# Patient Record
Sex: Female | Born: 1970 | Hispanic: No | Marital: Married | Smoking: Never smoker
Health system: Southern US, Community
[De-identification: ages and names within clinical notes are randomized; demographics above are authoritative.]

## PROBLEM LIST (undated history)

## (undated) HISTORY — PX: BREAST BIOPSY: SHX20

---

## 1997-07-18 ENCOUNTER — Encounter: Admission: RE | Admit: 1997-07-18 | Discharge: 1997-07-18 | Payer: Self-pay | Admitting: Sports Medicine

## 1997-08-15 ENCOUNTER — Encounter: Admission: RE | Admit: 1997-08-15 | Discharge: 1997-08-15 | Payer: Self-pay | Admitting: Family Medicine

## 1997-09-11 ENCOUNTER — Ambulatory Visit (HOSPITAL_COMMUNITY): Admission: RE | Admit: 1997-09-11 | Discharge: 1997-09-11 | Payer: Self-pay | Admitting: Family Medicine

## 1997-09-28 ENCOUNTER — Encounter: Admission: RE | Admit: 1997-09-28 | Discharge: 1997-09-28 | Payer: Self-pay | Admitting: Sports Medicine

## 1997-10-02 ENCOUNTER — Encounter: Admission: RE | Admit: 1997-10-02 | Discharge: 1997-10-02 | Payer: Self-pay | Admitting: Family Medicine

## 1997-10-27 ENCOUNTER — Ambulatory Visit (HOSPITAL_COMMUNITY): Admission: RE | Admit: 1997-10-27 | Discharge: 1997-10-27 | Payer: Self-pay | Admitting: *Deleted

## 1997-11-01 ENCOUNTER — Encounter: Admission: RE | Admit: 1997-11-01 | Discharge: 1997-11-01 | Payer: Self-pay | Admitting: Family Medicine

## 1997-11-02 ENCOUNTER — Encounter: Admission: RE | Admit: 1997-11-02 | Discharge: 1997-11-02 | Payer: Self-pay | Admitting: Family Medicine

## 1997-11-06 ENCOUNTER — Encounter: Admission: RE | Admit: 1997-11-06 | Discharge: 1997-11-06 | Payer: Self-pay | Admitting: Family Medicine

## 1997-11-16 ENCOUNTER — Encounter: Admission: RE | Admit: 1997-11-16 | Discharge: 1997-11-16 | Payer: Self-pay | Admitting: Family Medicine

## 1997-11-29 ENCOUNTER — Encounter: Admission: RE | Admit: 1997-11-29 | Discharge: 1997-11-29 | Payer: Self-pay | Admitting: Family Medicine

## 1997-12-06 ENCOUNTER — Encounter: Admission: RE | Admit: 1997-12-06 | Discharge: 1997-12-06 | Payer: Self-pay | Admitting: Family Medicine

## 1997-12-07 ENCOUNTER — Encounter: Admission: RE | Admit: 1997-12-07 | Discharge: 1997-12-07 | Payer: Self-pay | Admitting: Family Medicine

## 1997-12-12 ENCOUNTER — Encounter: Admission: RE | Admit: 1997-12-12 | Discharge: 1997-12-12 | Payer: Self-pay | Admitting: Family Medicine

## 1998-01-05 ENCOUNTER — Encounter: Admission: RE | Admit: 1998-01-05 | Discharge: 1998-01-05 | Payer: Self-pay | Admitting: Family Medicine

## 1998-01-06 ENCOUNTER — Inpatient Hospital Stay (HOSPITAL_COMMUNITY): Admission: AD | Admit: 1998-01-06 | Discharge: 1998-01-06 | Payer: Self-pay | Admitting: Obstetrics

## 1998-01-10 ENCOUNTER — Encounter: Admission: RE | Admit: 1998-01-10 | Discharge: 1998-01-10 | Payer: Self-pay | Admitting: Family Medicine

## 1998-01-10 ENCOUNTER — Inpatient Hospital Stay (HOSPITAL_COMMUNITY): Admission: AD | Admit: 1998-01-10 | Discharge: 1998-01-10 | Payer: Self-pay | Admitting: Obstetrics

## 1998-01-18 ENCOUNTER — Encounter: Admission: RE | Admit: 1998-01-18 | Discharge: 1998-01-18 | Payer: Self-pay | Admitting: Family Medicine

## 1998-01-19 ENCOUNTER — Inpatient Hospital Stay (HOSPITAL_COMMUNITY): Admission: AD | Admit: 1998-01-19 | Discharge: 1998-01-20 | Payer: Self-pay | Admitting: *Deleted

## 1998-03-05 ENCOUNTER — Encounter: Admission: RE | Admit: 1998-03-05 | Discharge: 1998-03-05 | Payer: Self-pay | Admitting: Family Medicine

## 1998-04-17 ENCOUNTER — Encounter: Admission: RE | Admit: 1998-04-17 | Discharge: 1998-04-17 | Payer: Self-pay | Admitting: Sports Medicine

## 1998-04-20 ENCOUNTER — Encounter: Admission: RE | Admit: 1998-04-20 | Discharge: 1998-04-20 | Payer: Self-pay | Admitting: Obstetrics & Gynecology

## 2003-07-06 ENCOUNTER — Other Ambulatory Visit: Admission: RE | Admit: 2003-07-06 | Discharge: 2003-07-06 | Payer: Self-pay | Admitting: *Deleted

## 2006-06-09 ENCOUNTER — Encounter: Admission: RE | Admit: 2006-06-09 | Discharge: 2006-06-09 | Payer: Self-pay | Admitting: *Deleted

## 2006-11-18 ENCOUNTER — Encounter: Admission: RE | Admit: 2006-11-18 | Discharge: 2006-11-18 | Payer: Self-pay | Admitting: *Deleted

## 2007-01-15 ENCOUNTER — Encounter (INDEPENDENT_AMBULATORY_CARE_PROVIDER_SITE_OTHER): Payer: Self-pay | Admitting: Diagnostic Radiology

## 2007-01-15 ENCOUNTER — Encounter: Admission: RE | Admit: 2007-01-15 | Discharge: 2007-01-15 | Payer: Self-pay | Admitting: Family Medicine

## 2007-05-11 IMAGING — US UNKNOWN US STUDY
1 series · 4 of 4 positions shown · non-contrast
Comparison: none

DG DIAGNOSTIC BILATERAL
Bilateral CC and MLO view(s) were taken.

LEFT BREAST ULTRASOUND
DIGITAL BILATERAL DIAGNOSTIC MAMMOGRAM AND LEFT BREAST ULTRASOUND:
CLINICAL DATA: Left breast lump in the upper inner quadrant.

[Series 1: unknown us study · 4 of 4 slices shown]
[im 1/4]
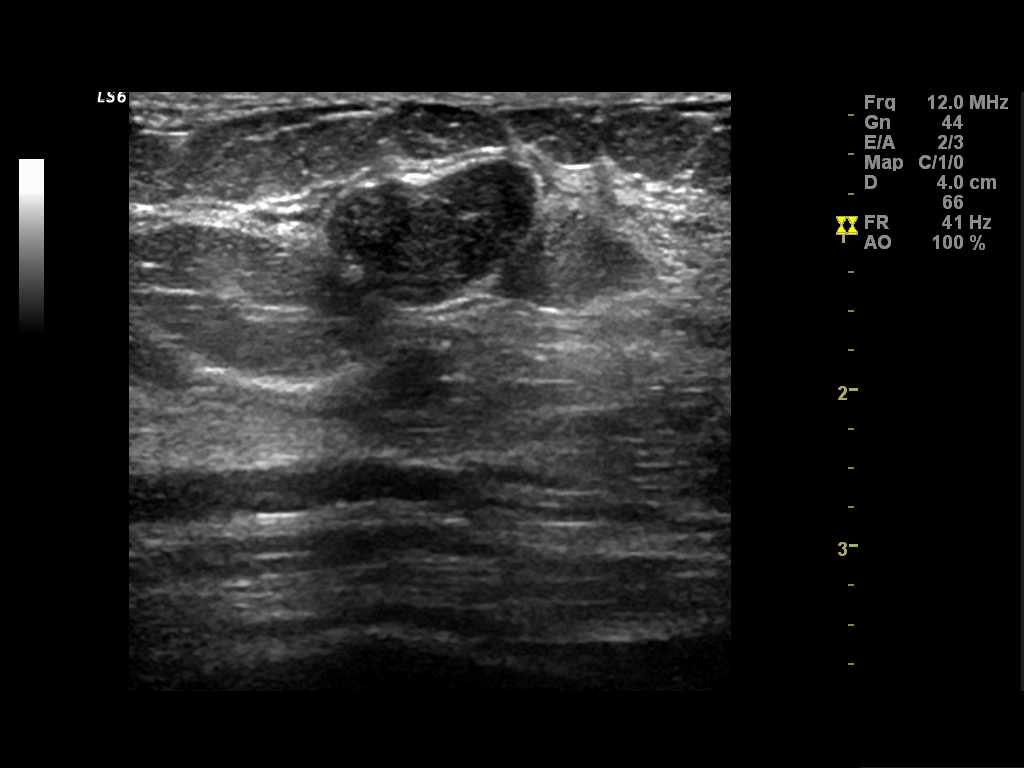
[im 2/4]
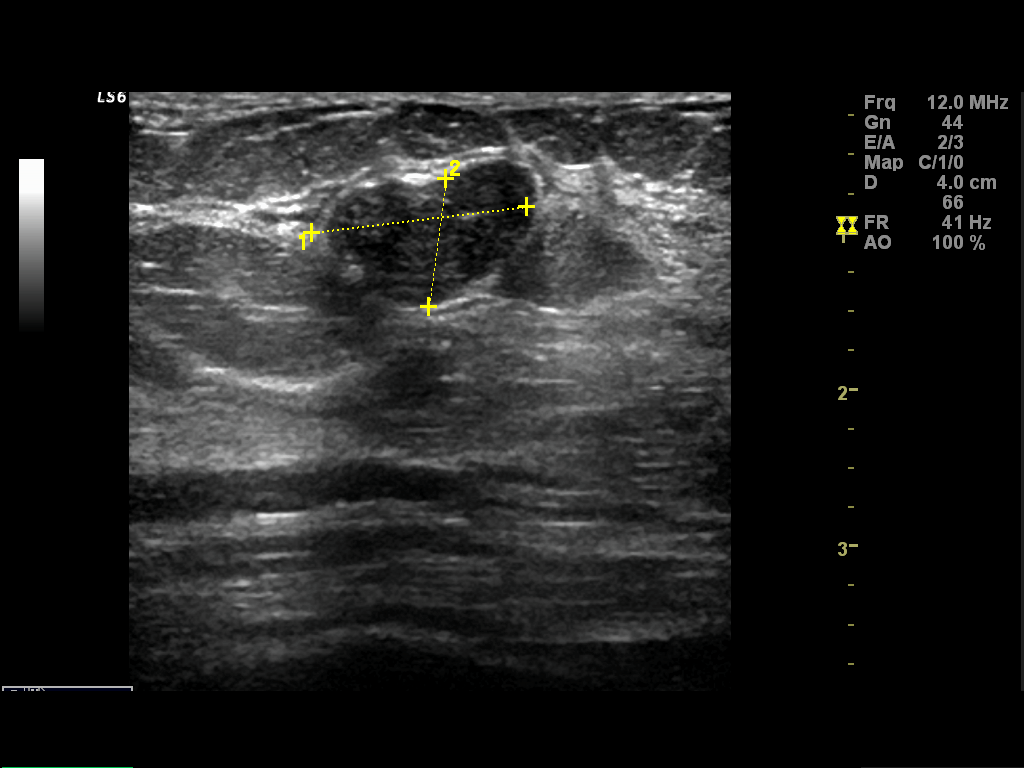
[im 3/4]
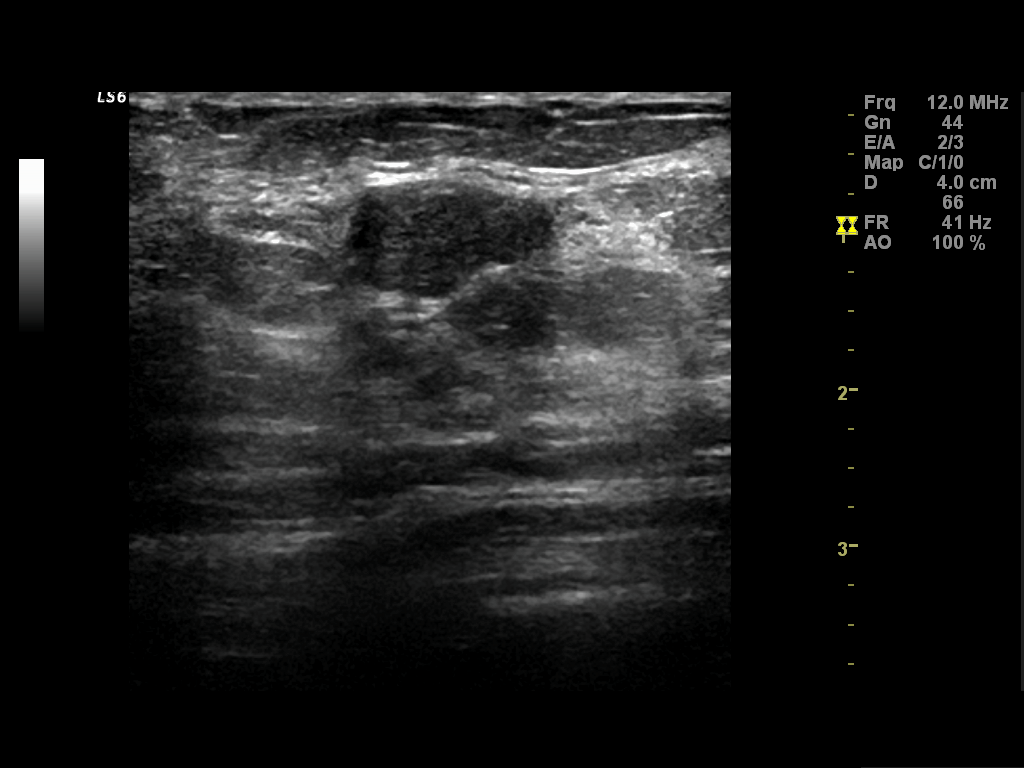
[im 4/4]
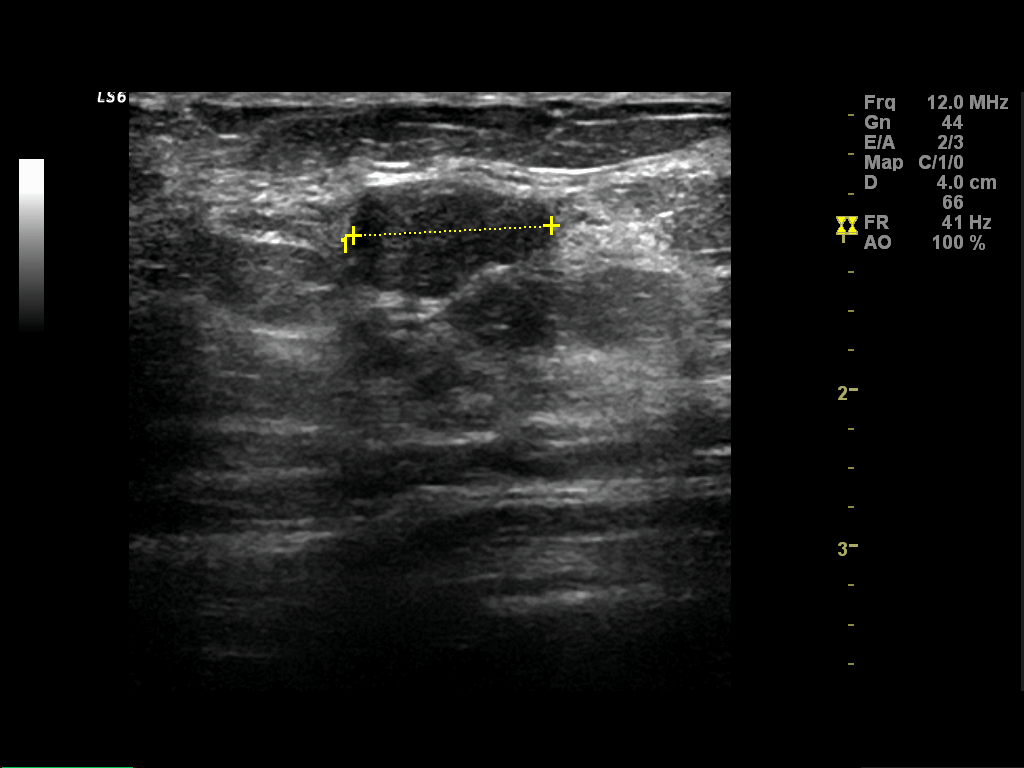

[4 of 4 positions shown; findings below may reference images not displayed]

This is the patient's baseline mammogram.

The fibroglandular parenchyma is symmetric.  There is a well-defined nodule in the upper left 
breast.  No suspicious calcifications or areas of architectural distortion are seen bilaterally.

Physical exam of the left breast reveals diffuse lumpy tissue throughout the upper inner quadrant. 
In the patient's region of palpable concern, dense fibroglandular tissue only is identified.  
Ultrasound also reveals a  well-defined solid mass with echogenic margins and increased through 
transmission at the [DATE] position, 2 cm from the nipple which correlates with the well-defined 
mass seen on the mammogram.  This separate from the patient's area of palpable concern.  The 
overall dimensions of this mass are 1.4 x 0.8 x 1.3 cm.
IMPRESSION: There is no specific radiographic evidence of malignancy bilaterally.  Findings are consistent with
a 1.3 cm fibroadenoma at the [DATE] position of the left breast, 2 cm from the nipple which is 
separate from the patient's area of palpable concern on the left.  The patient's area of palpable 
concern represents normal fibroglandular tissue only.  Six-month follow-up left breast ultrasound 
is recommended.  Options of surgical excision or biopsy were also discussed with the patient.

ASSESSMENT: Probably benign - BI-RADS 3

Ultrasound of the left breast in 6 months.
,

## 2010-05-06 ENCOUNTER — Encounter: Payer: Self-pay | Admitting: General Surgery

## 2010-07-03 ENCOUNTER — Other Ambulatory Visit: Payer: Self-pay | Admitting: Obstetrics and Gynecology

## 2012-01-07 ENCOUNTER — Other Ambulatory Visit: Payer: Self-pay | Admitting: General Practice

## 2012-01-07 DIAGNOSIS — Z1231 Encounter for screening mammogram for malignant neoplasm of breast: Secondary | ICD-10-CM

## 2012-01-28 ENCOUNTER — Other Ambulatory Visit: Payer: Self-pay | Admitting: Obstetrics and Gynecology

## 2012-01-28 ENCOUNTER — Ambulatory Visit
Admission: RE | Admit: 2012-01-28 | Discharge: 2012-01-28 | Disposition: A | Discharge status: Home / Self Care | Payer: Self-pay | Source: Ambulatory Visit | Attending: General Practice | Admitting: General Practice

## 2012-01-28 DIAGNOSIS — Z1231 Encounter for screening mammogram for malignant neoplasm of breast: Secondary | ICD-10-CM

## 2013-01-26 ENCOUNTER — Other Ambulatory Visit: Payer: Self-pay | Admitting: Obstetrics and Gynecology

## 2013-01-26 DIAGNOSIS — Z1231 Encounter for screening mammogram for malignant neoplasm of breast: Secondary | ICD-10-CM

## 2013-02-24 ENCOUNTER — Ambulatory Visit
Admission: RE | Admit: 2013-02-24 | Discharge: 2013-02-24 | Disposition: A | Discharge status: Home / Self Care | Payer: Self-pay | Source: Ambulatory Visit | Attending: Obstetrics and Gynecology | Admitting: Obstetrics and Gynecology

## 2013-02-24 DIAGNOSIS — Z1231 Encounter for screening mammogram for malignant neoplasm of breast: Secondary | ICD-10-CM

## 2014-01-26 ENCOUNTER — Other Ambulatory Visit: Payer: Self-pay

## 2014-01-26 DIAGNOSIS — Z1239 Encounter for other screening for malignant neoplasm of breast: Secondary | ICD-10-CM

## 2014-01-26 IMAGING — MG STANDARD SCREENING - COMBO
8 series · 8 of 24 positions shown · non-contrast
Comparison: Previous exam(s).

CLINICAL DATA: Screening.

EXAM:
DIGITAL SCREENING BILATERAL MAMMOGRAM WITH CAD
DIGITAL BREAST TOMOSYNTHESIS
Digital breast tomosynthesis images are acquired in two projections.
These images are reviewed in combination with the digital mammogram,
confirming the findings below.

[R MLO]
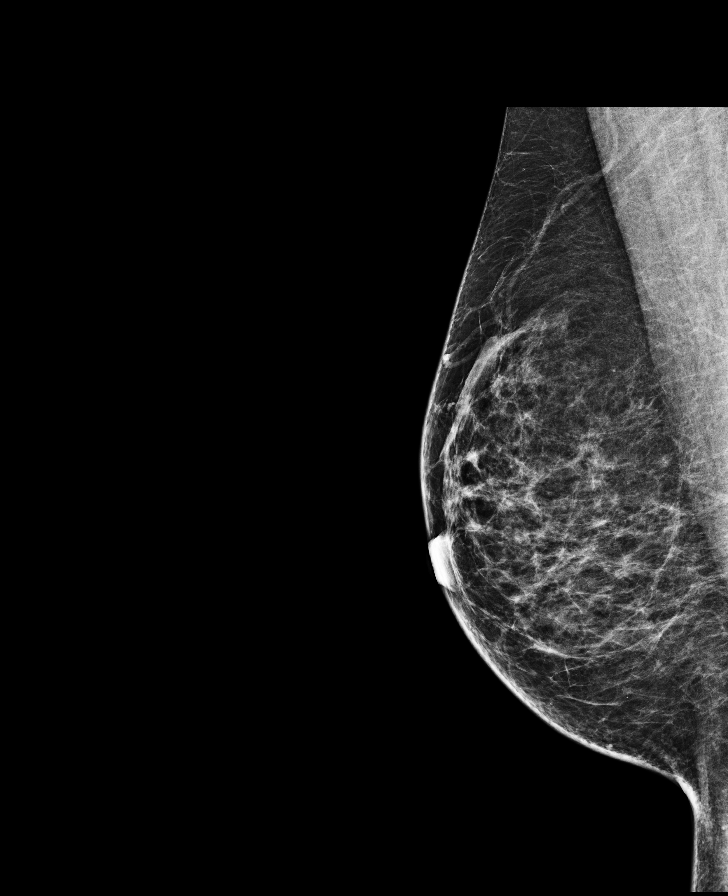

[L MLO]
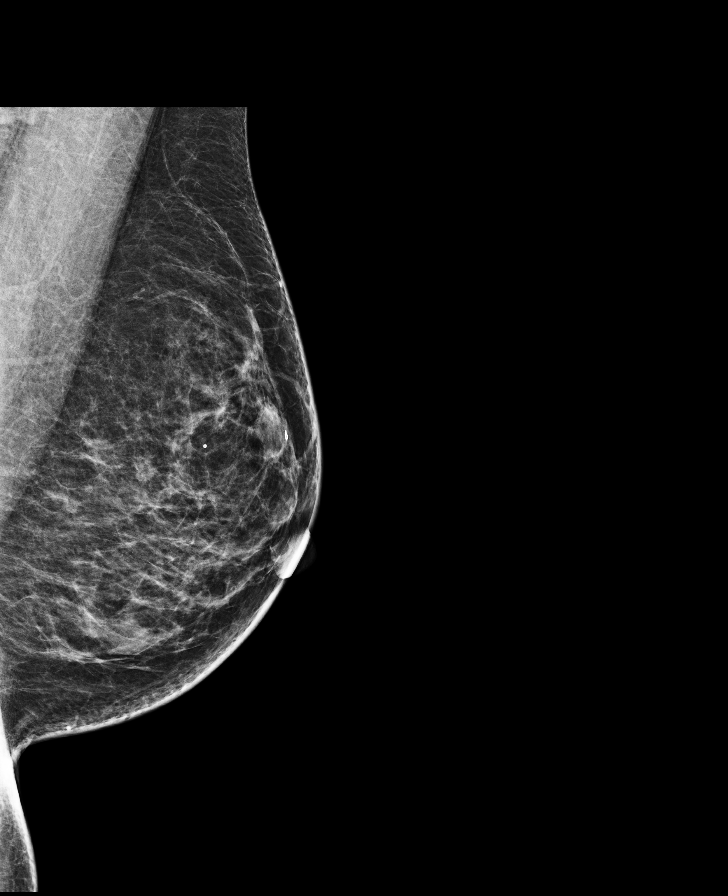

[L CC]
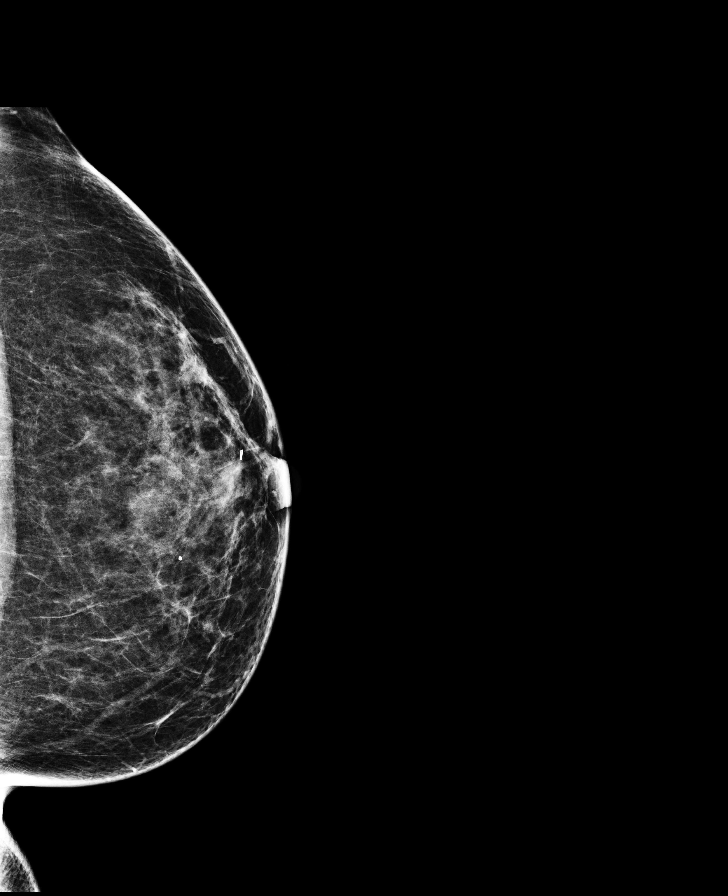

[R CC]
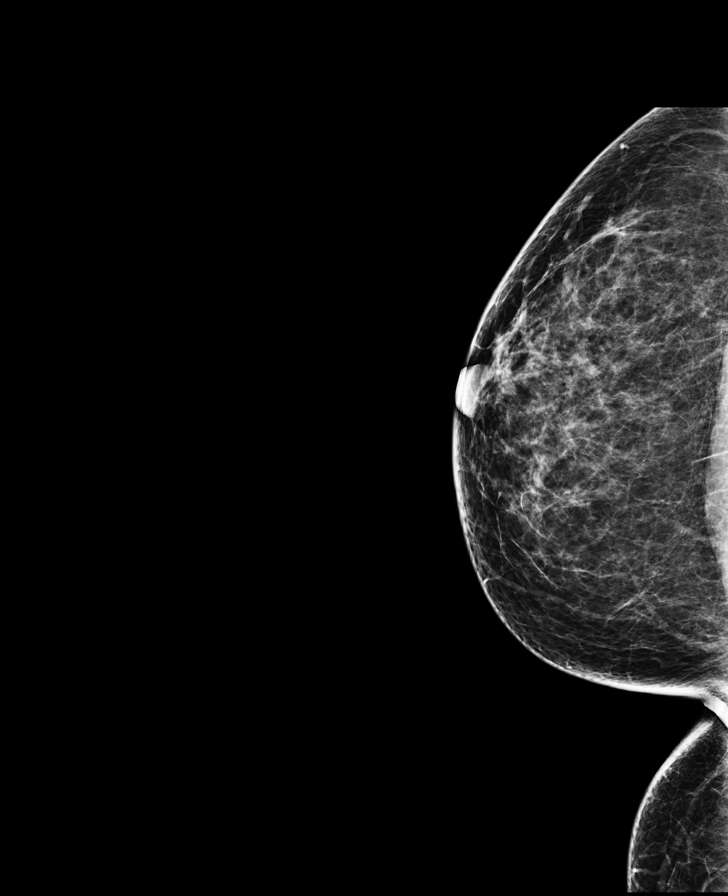

[L CC tomo · tomo slice 37/72.0]
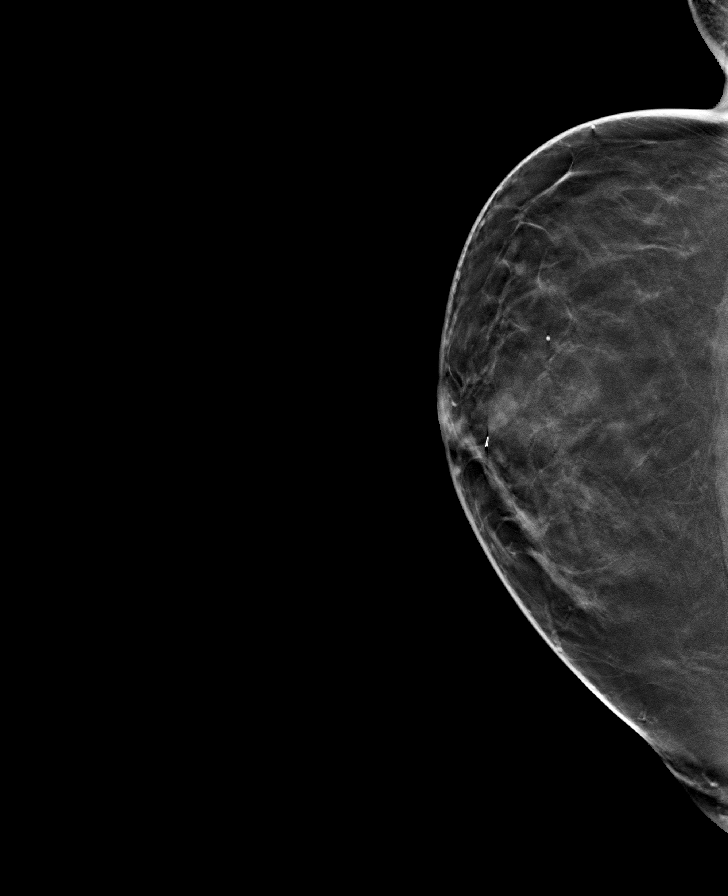

[L MLO tomo · tomo slice 35/70.0]
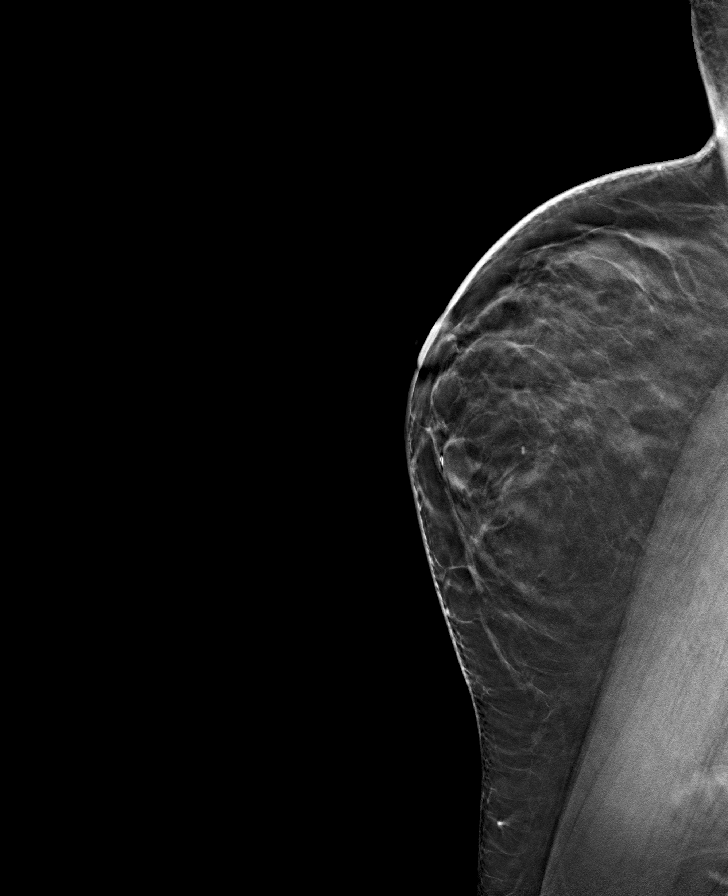

[R MLO tomo · tomo slice 34/67.0]
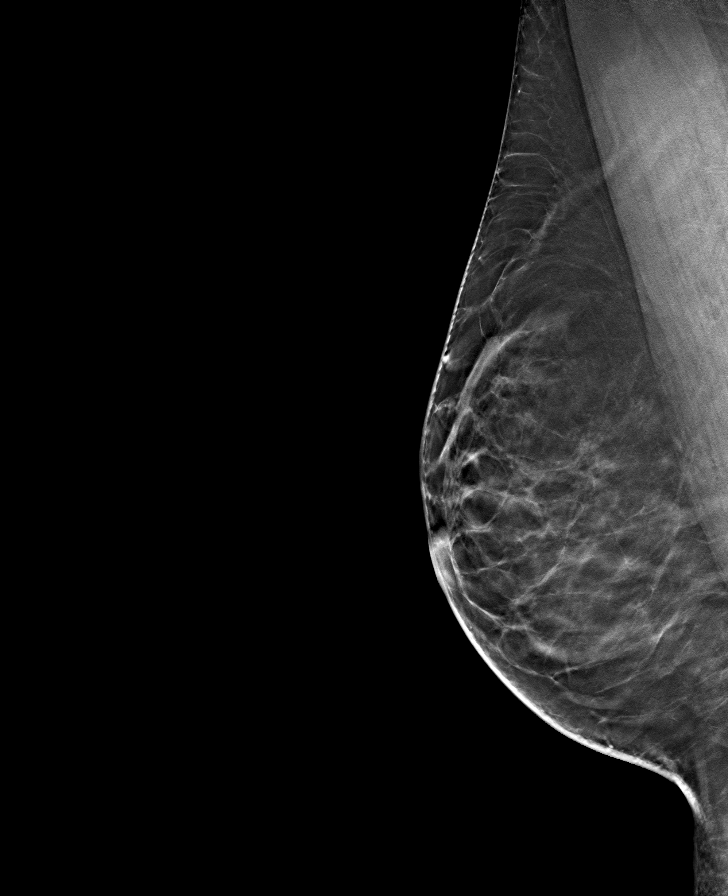

[R CC tomo · tomo slice 37/72.0]
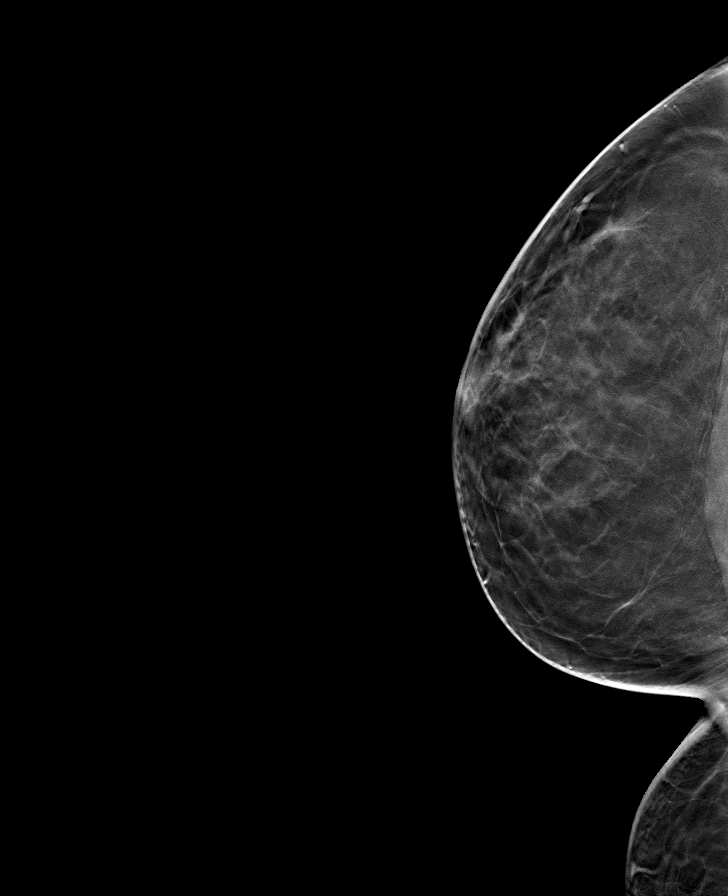

[8 of 24 positions shown; findings below may reference images not displayed]

ACR Breast Density Category c: The breasts are heterogeneously
dense, which may obscure small masses.
FINDINGS: There are no findings suspicious for malignancy. Images were
processed with CAD.
IMPRESSION: No mammographic evidence of malignancy. A result letter of this
screening mammogram will be mailed directly to the patient.

RECOMMENDATION:
Screening mammogram in one year. (Code:R9-[id number])

BI-RADS CATEGORY  1: Negative

## 2014-02-24 ENCOUNTER — Other Ambulatory Visit: Payer: Self-pay

## 2014-02-24 DIAGNOSIS — Z1231 Encounter for screening mammogram for malignant neoplasm of breast: Secondary | ICD-10-CM

## 2014-02-27 ENCOUNTER — Ambulatory Visit
Admission: RE | Admit: 2014-02-27 | Discharge: 2014-02-27 | Disposition: A | Discharge status: Home / Self Care | Payer: BC Managed Care – PPO | Source: Ambulatory Visit

## 2014-02-27 DIAGNOSIS — Z1231 Encounter for screening mammogram for malignant neoplasm of breast: Secondary | ICD-10-CM

## 2015-01-29 IMAGING — MG MM SCREENING BREAST TOMO BILATERAL
8 series · 8 of 24 positions shown · non-contrast
Comparison: Previous exam(s).

CLINICAL DATA: Screening.

EXAM:
DIGITAL SCREENING BILATERAL MAMMOGRAM WITH 3D TOMO WITH CAD

[R CC]
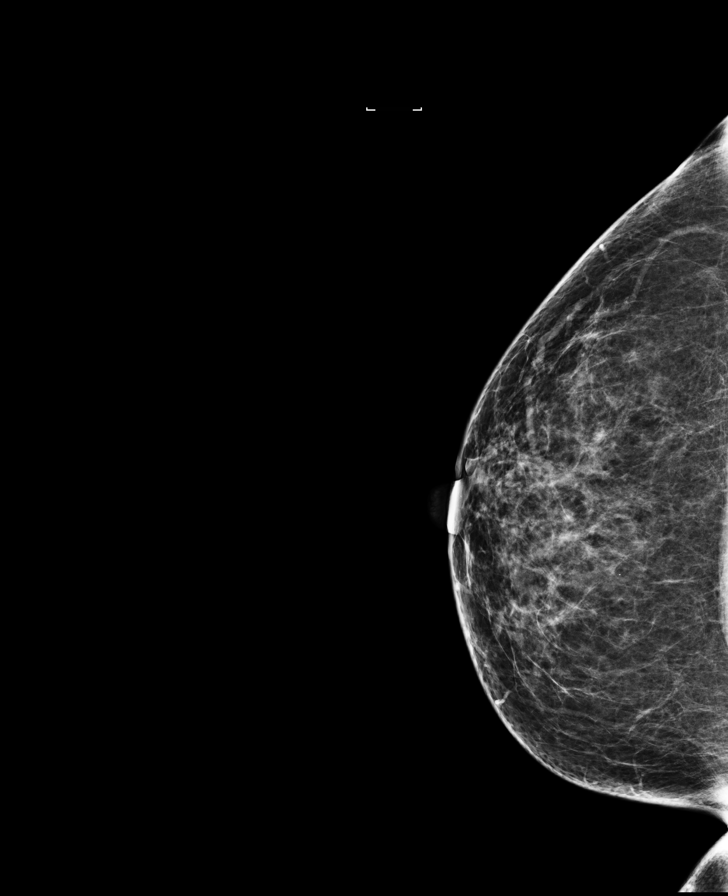

[L CC]
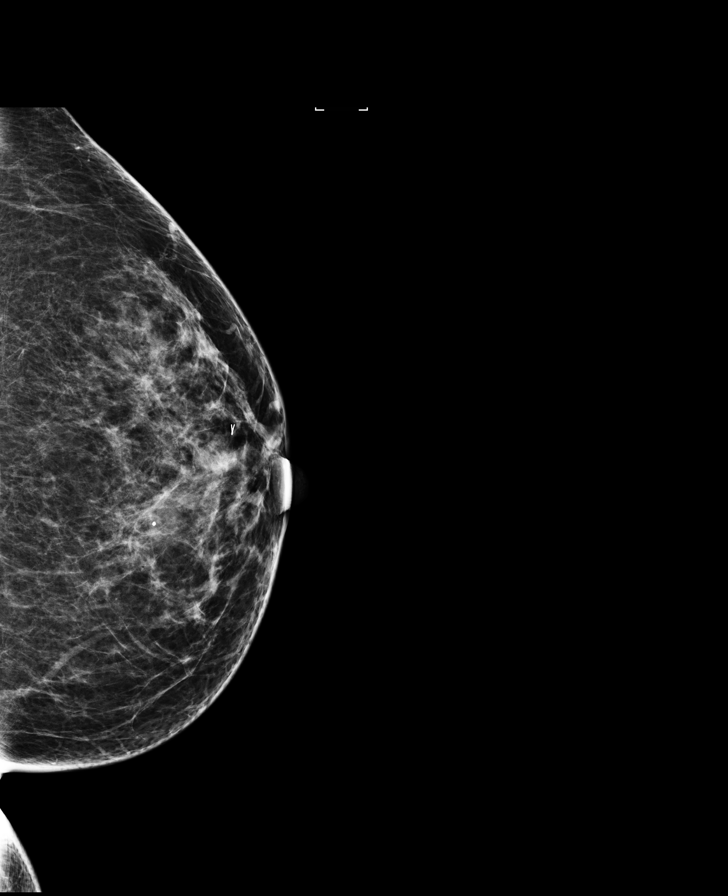

[R MLO]
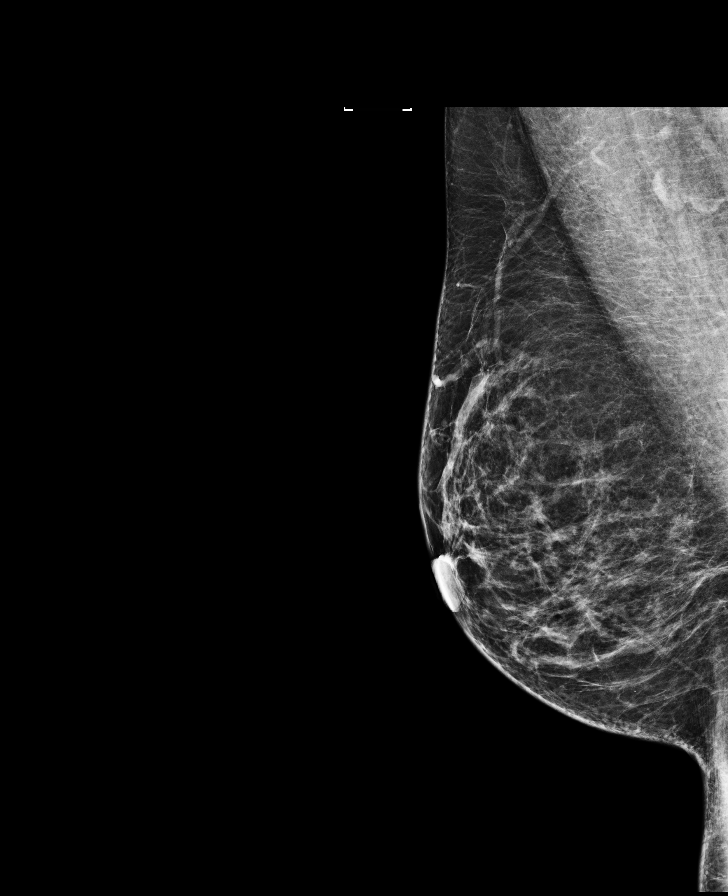

[L MLO]
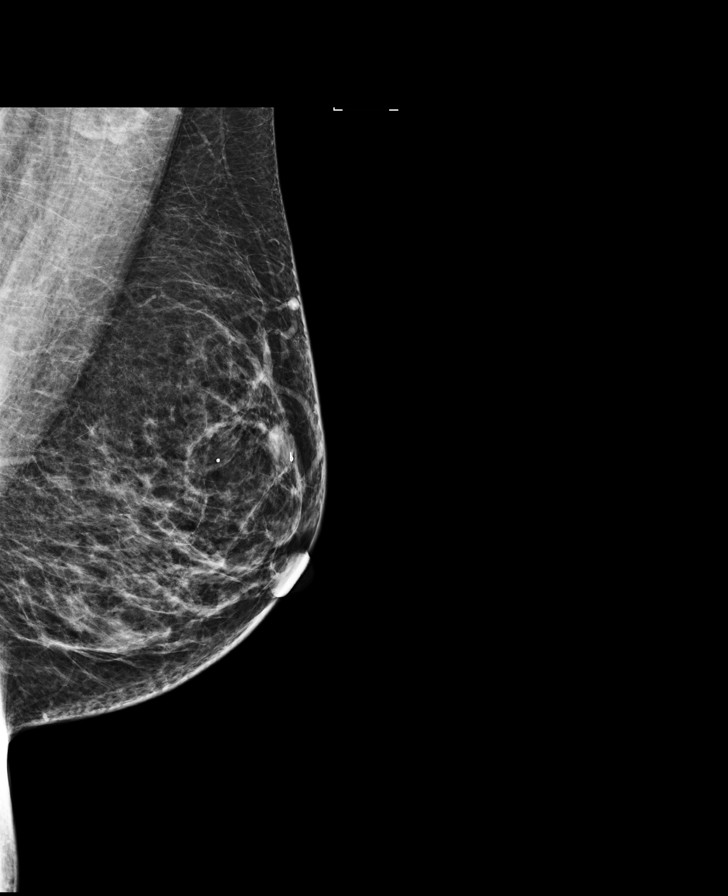

[R CC tomo · tomo slice 38/75.0]
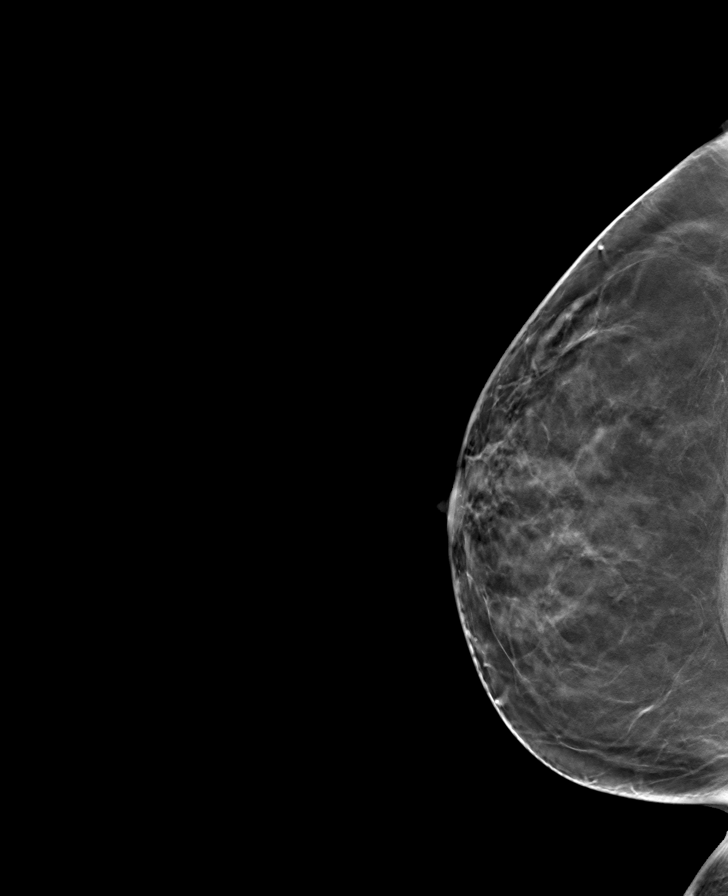

[R MLO tomo · tomo slice 41/80.0]
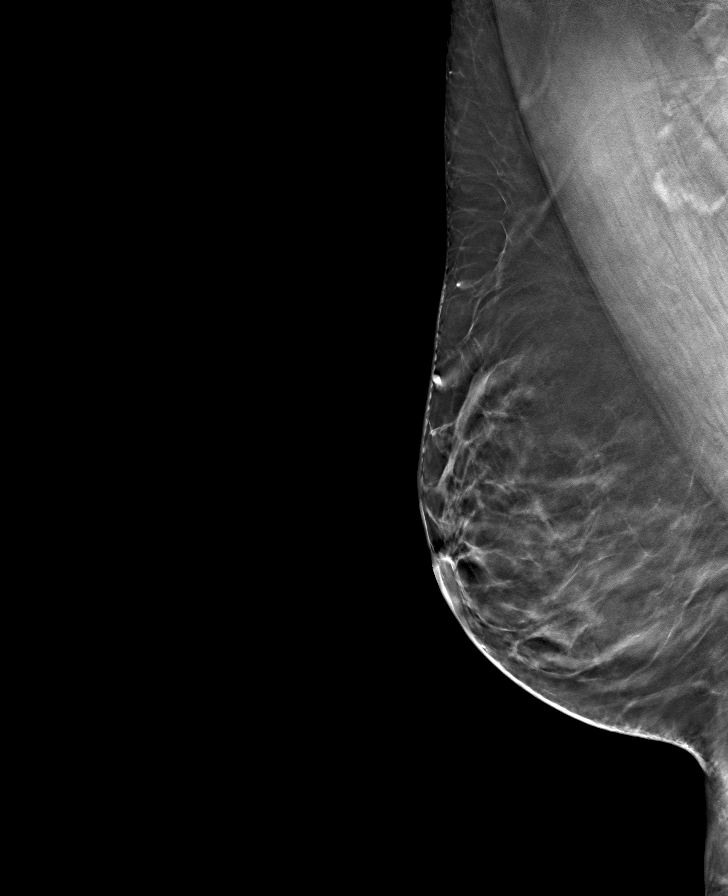

[L CC tomo · tomo slice 35/69.0]
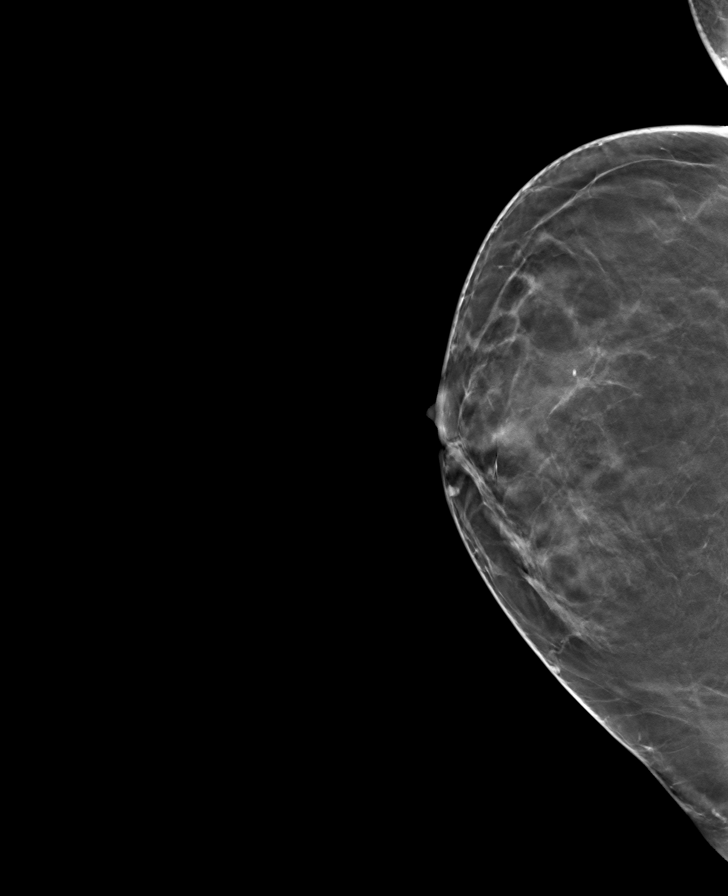

[L MLO tomo · tomo slice 40/79.0]
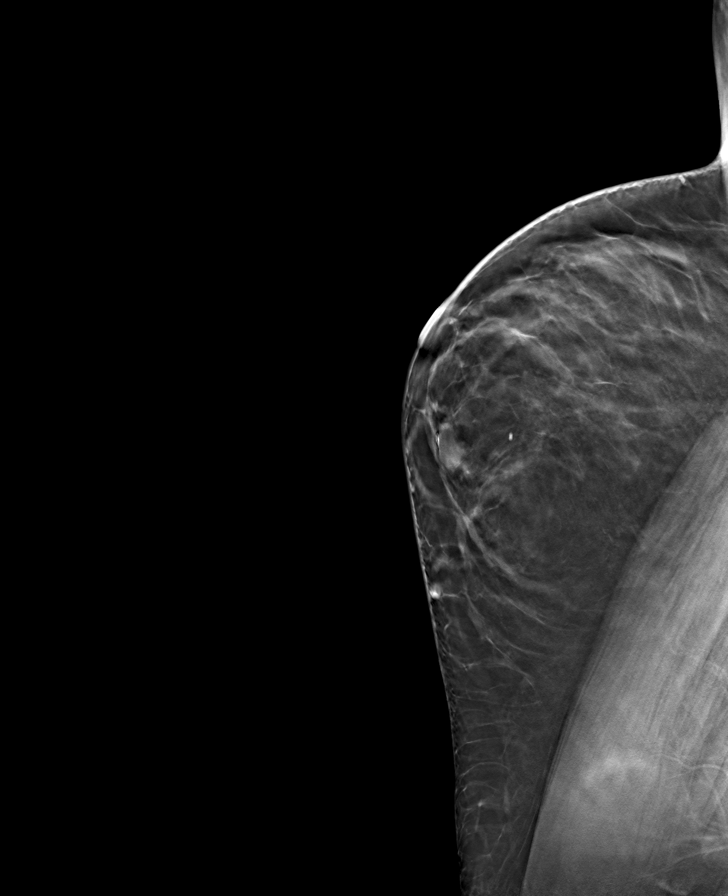

[8 of 24 positions shown; findings below may reference images not displayed]

ACR Breast Density Category b: There are scattered areas of
fibroglandular density.
FINDINGS: There are no findings suspicious for malignancy. Images were
processed with CAD.
IMPRESSION: No mammographic evidence of malignancy. A result letter of this
screening mammogram will be mailed directly to the patient.

RECOMMENDATION:
Screening mammogram in one year. (Code:[id number])

BI-RADS CATEGORY  1: Negative.

## 2015-02-05 ENCOUNTER — Other Ambulatory Visit: Payer: Self-pay

## 2015-02-05 DIAGNOSIS — Z1231 Encounter for screening mammogram for malignant neoplasm of breast: Secondary | ICD-10-CM

## 2015-03-05 ENCOUNTER — Ambulatory Visit
Admission: RE | Admit: 2015-03-05 | Discharge: 2015-03-05 | Disposition: A | Discharge status: Home / Self Care | Payer: BLUE CROSS/BLUE SHIELD | Source: Ambulatory Visit

## 2015-03-05 DIAGNOSIS — Z1231 Encounter for screening mammogram for malignant neoplasm of breast: Secondary | ICD-10-CM

## 2015-06-18 ENCOUNTER — Other Ambulatory Visit: Payer: Self-pay | Admitting: Obstetrics and Gynecology

## 2015-06-20 LAB — CYTOLOGY - PAP

## 2015-12-19 DIAGNOSIS — R350 Frequency of micturition: Secondary | ICD-10-CM | POA: Diagnosis not present

## 2015-12-19 DIAGNOSIS — Z6828 Body mass index (BMI) 28.0-28.9, adult: Secondary | ICD-10-CM | POA: Diagnosis not present

## 2015-12-19 DIAGNOSIS — N390 Urinary tract infection, site not specified: Secondary | ICD-10-CM | POA: Diagnosis not present

## 2016-02-04 IMAGING — MG MM SCREENING BREAST TOMO BILATERAL
8 series · 8 of 24 positions shown · non-contrast
Comparison: Previous exam(s).

CLINICAL DATA: Screening.

EXAM:
DIGITAL SCREENING BILATERAL MAMMOGRAM WITH 3D TOMO WITH CAD

[L MLO]
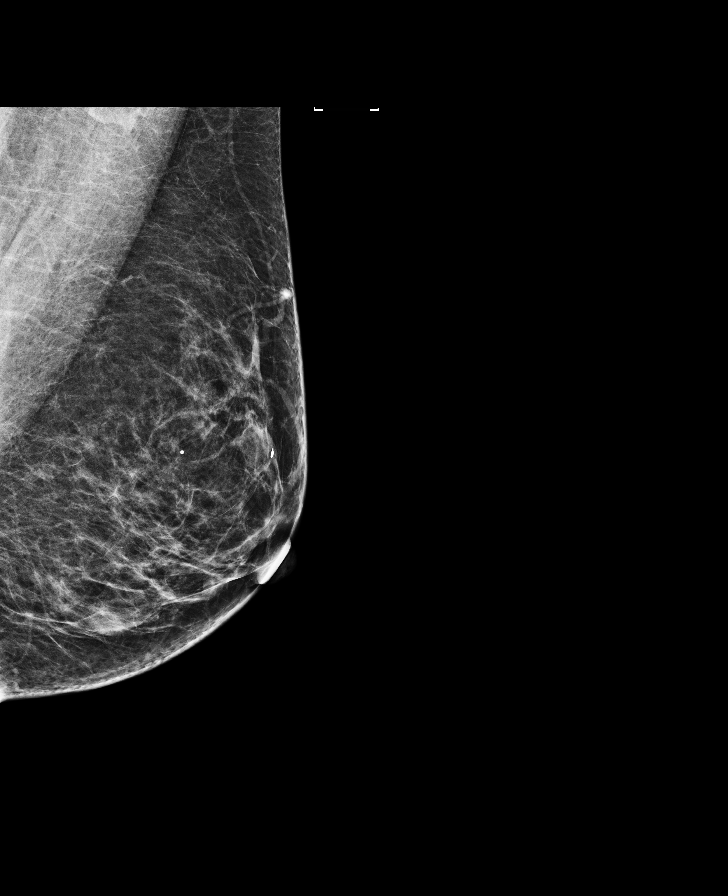

[R CC]
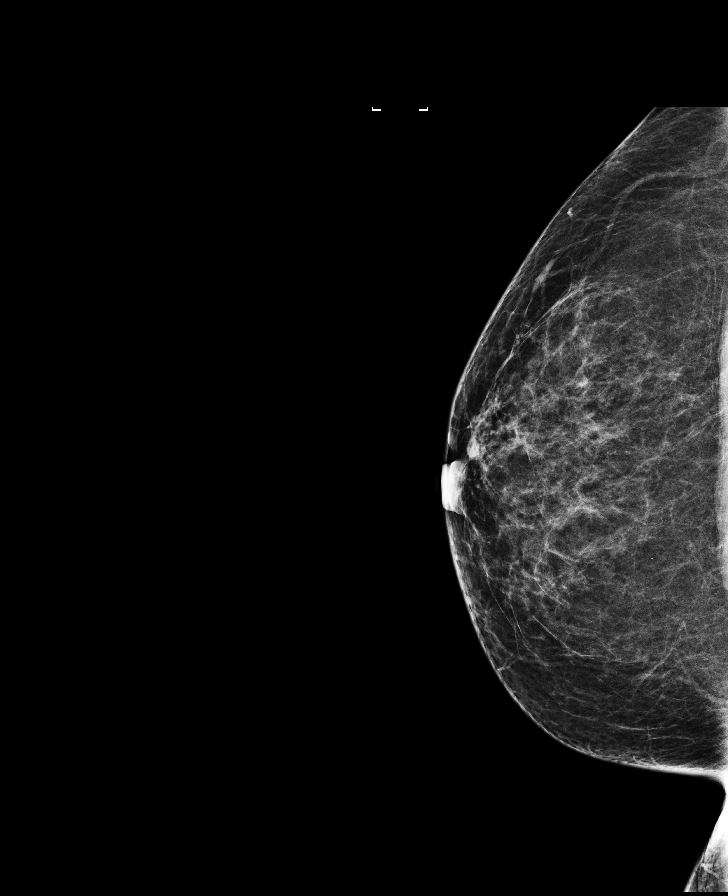

[L CC]
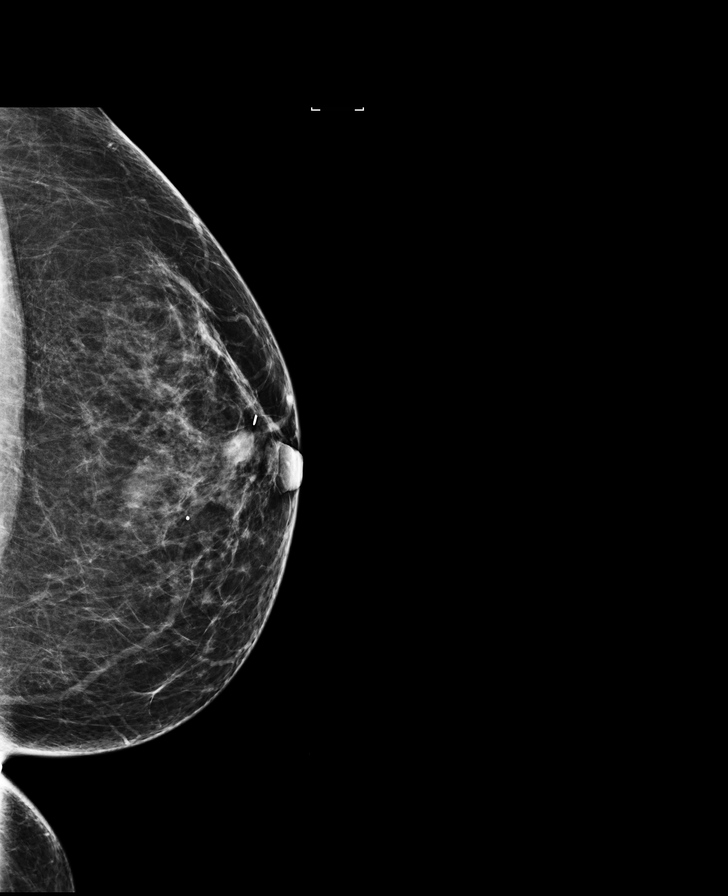

[R MLO]
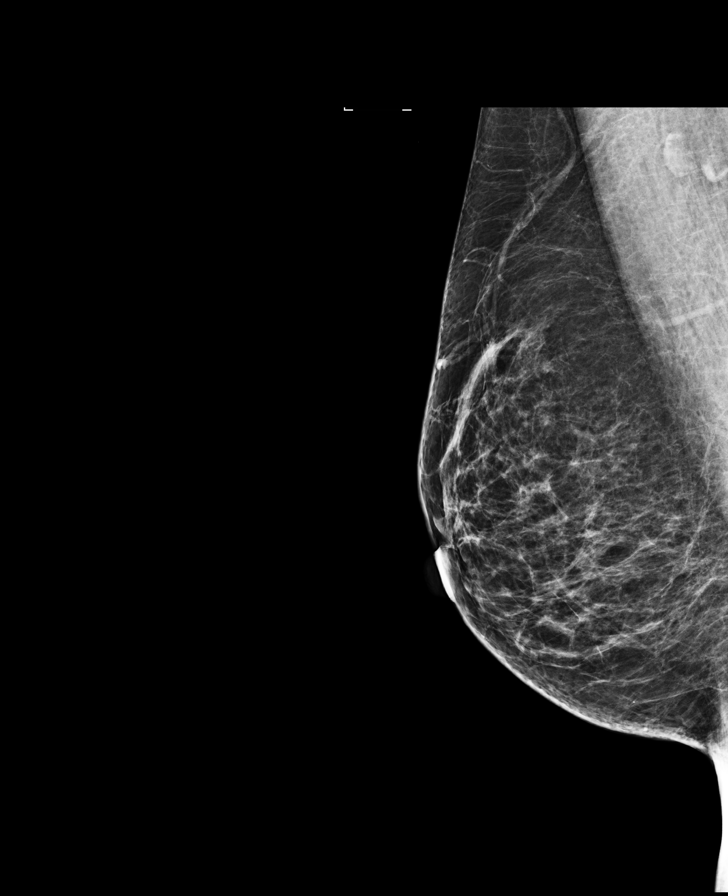

[R CC tomo · tomo slice 36/71.0]
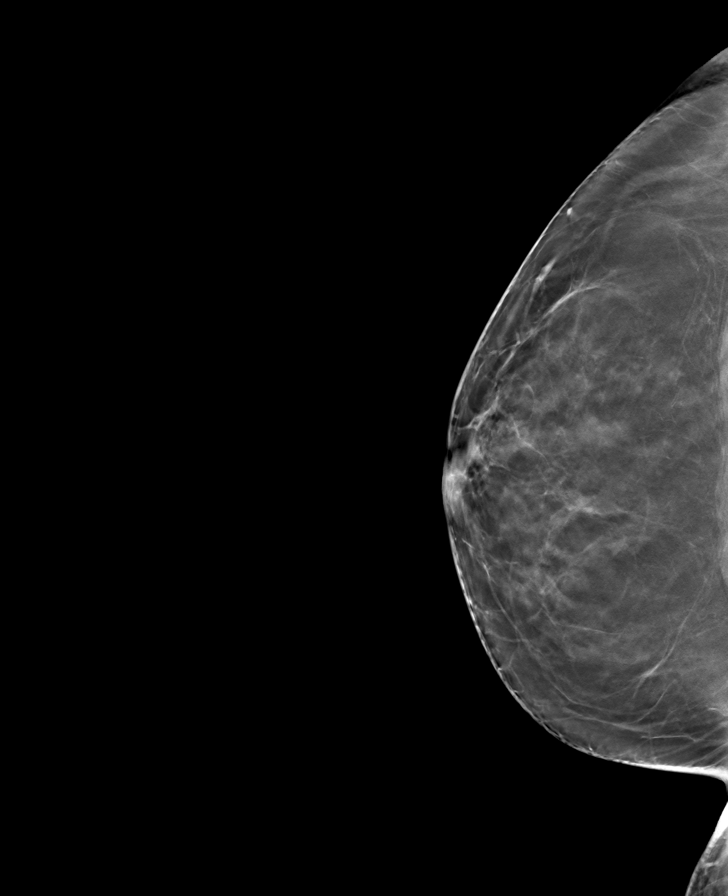

[L CC tomo · tomo slice 37/74.0]
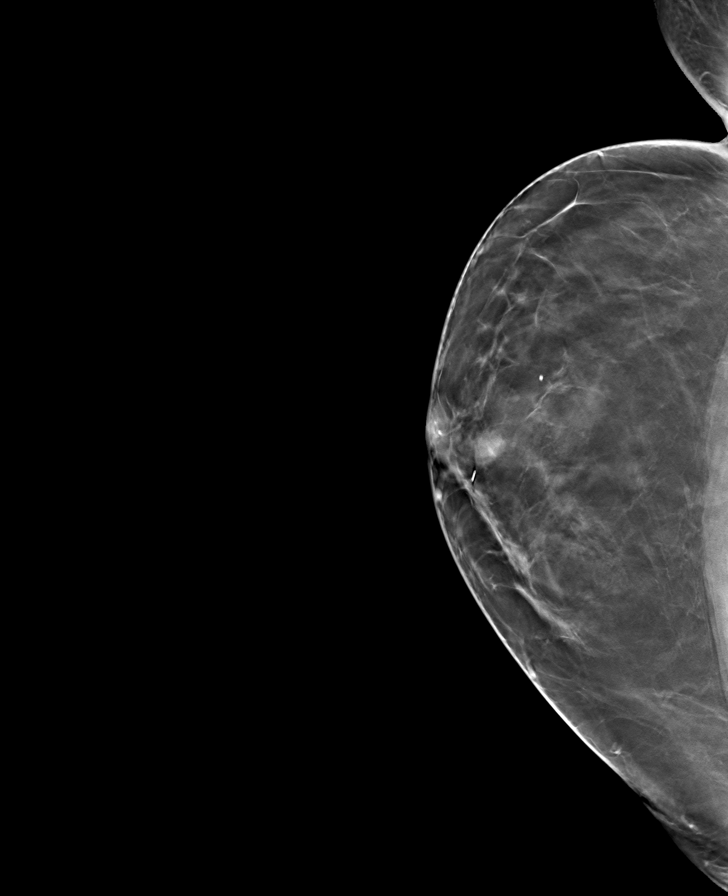

[R MLO tomo · tomo slice 38/75.0]
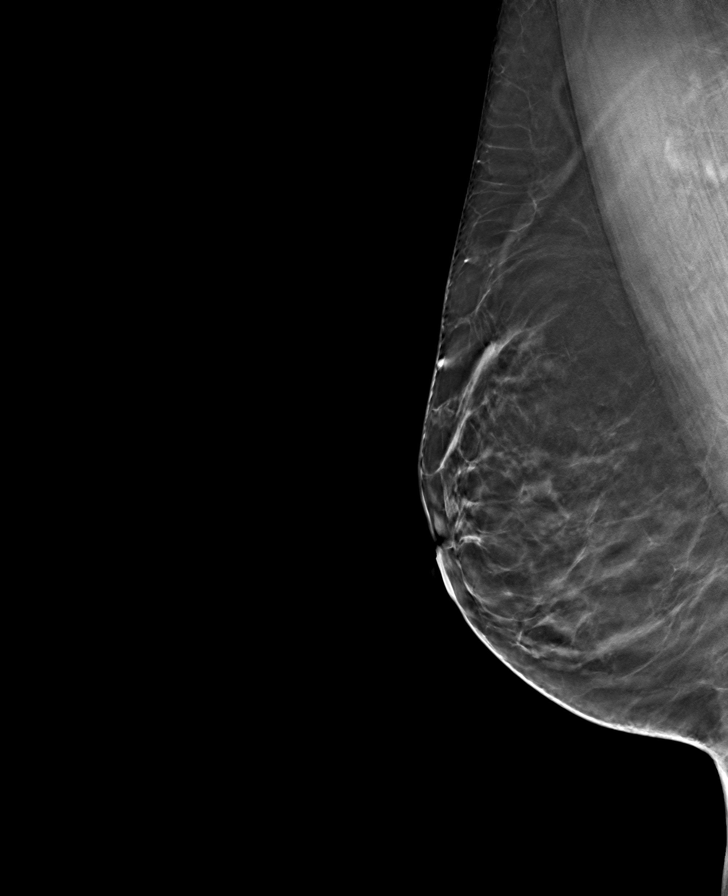

[L MLO tomo · tomo slice 40/79.0]
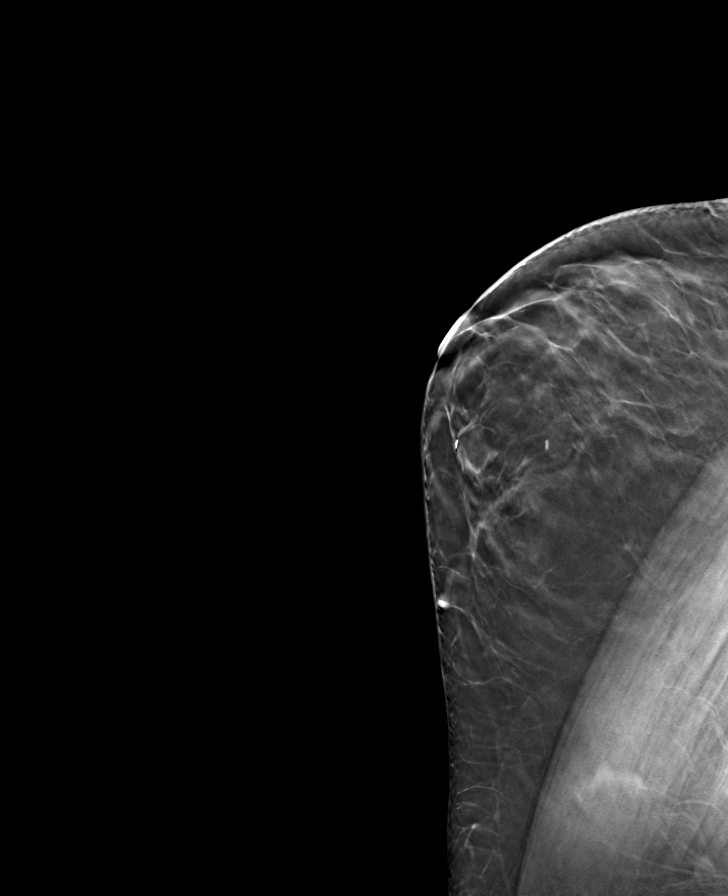

[8 of 24 positions shown; findings below may reference images not displayed]

ACR Breast Density Category b: There are scattered areas of
fibroglandular density.
FINDINGS: There are no findings suspicious for malignancy. Images were
processed with CAD.
IMPRESSION: No mammographic evidence of malignancy. A result letter of this
screening mammogram will be mailed directly to the patient.

RECOMMENDATION:
Screening mammogram in one year. (Code:[id number])

BI-RADS CATEGORY  1: Negative.

## 2016-03-05 DIAGNOSIS — Z1231 Encounter for screening mammogram for malignant neoplasm of breast: Secondary | ICD-10-CM | POA: Diagnosis not present

## 2016-06-30 ENCOUNTER — Other Ambulatory Visit: Payer: Self-pay | Admitting: Obstetrics and Gynecology

## 2016-06-30 DIAGNOSIS — Z124 Encounter for screening for malignant neoplasm of cervix: Secondary | ICD-10-CM | POA: Diagnosis not present

## 2016-07-01 LAB — CYTOLOGY - PAP

## 2016-07-15 ENCOUNTER — Other Ambulatory Visit: Payer: Self-pay | Admitting: Plastic Surgery

## 2016-07-15 DIAGNOSIS — C44319 Basal cell carcinoma of skin of other parts of face: Secondary | ICD-10-CM | POA: Diagnosis not present

## 2016-08-29 DIAGNOSIS — M25571 Pain in right ankle and joints of right foot: Secondary | ICD-10-CM | POA: Diagnosis not present

## 2016-08-29 DIAGNOSIS — M722 Plantar fascial fibromatosis: Secondary | ICD-10-CM | POA: Diagnosis not present

## 2016-10-21 DIAGNOSIS — Z6830 Body mass index (BMI) 30.0-30.9, adult: Secondary | ICD-10-CM | POA: Diagnosis not present

## 2016-10-21 DIAGNOSIS — L292 Pruritus vulvae: Secondary | ICD-10-CM | POA: Diagnosis not present

## 2016-10-21 DIAGNOSIS — N760 Acute vaginitis: Secondary | ICD-10-CM | POA: Diagnosis not present

## 2017-01-01 DIAGNOSIS — Z13220 Encounter for screening for lipoid disorders: Secondary | ICD-10-CM | POA: Diagnosis not present

## 2017-01-01 DIAGNOSIS — Z0000 Encounter for general adult medical examination without abnormal findings: Secondary | ICD-10-CM | POA: Diagnosis not present

## 2017-01-01 DIAGNOSIS — Z23 Encounter for immunization: Secondary | ICD-10-CM | POA: Diagnosis not present

## 2017-07-02 DIAGNOSIS — Z124 Encounter for screening for malignant neoplasm of cervix: Secondary | ICD-10-CM | POA: Diagnosis not present

## 2017-07-02 DIAGNOSIS — Z01419 Encounter for gynecological examination (general) (routine) without abnormal findings: Secondary | ICD-10-CM | POA: Diagnosis not present

## 2017-07-02 DIAGNOSIS — Z1231 Encounter for screening mammogram for malignant neoplasm of breast: Secondary | ICD-10-CM | POA: Diagnosis not present

## 2017-07-02 DIAGNOSIS — Z6830 Body mass index (BMI) 30.0-30.9, adult: Secondary | ICD-10-CM | POA: Diagnosis not present

## 2017-12-23 DIAGNOSIS — L821 Other seborrheic keratosis: Secondary | ICD-10-CM | POA: Diagnosis not present

## 2017-12-23 DIAGNOSIS — D225 Melanocytic nevi of trunk: Secondary | ICD-10-CM | POA: Diagnosis not present

## 2017-12-23 DIAGNOSIS — D235 Other benign neoplasm of skin of trunk: Secondary | ICD-10-CM | POA: Diagnosis not present

## 2019-01-24 DIAGNOSIS — Z01419 Encounter for gynecological examination (general) (routine) without abnormal findings: Secondary | ICD-10-CM | POA: Diagnosis not present

## 2019-01-24 DIAGNOSIS — Z124 Encounter for screening for malignant neoplasm of cervix: Secondary | ICD-10-CM | POA: Diagnosis not present

## 2019-01-24 DIAGNOSIS — Z6831 Body mass index (BMI) 31.0-31.9, adult: Secondary | ICD-10-CM | POA: Diagnosis not present

## 2019-01-24 DIAGNOSIS — Z1231 Encounter for screening mammogram for malignant neoplasm of breast: Secondary | ICD-10-CM | POA: Diagnosis not present

## 2019-06-24 ENCOUNTER — Ambulatory Visit: Payer: BC Managed Care – PPO | Attending: Internal Medicine

## 2019-06-24 ENCOUNTER — Ambulatory Visit: Payer: Self-pay | Attending: Internal Medicine

## 2019-06-24 DIAGNOSIS — Z23 Encounter for immunization: Secondary | ICD-10-CM

## 2019-06-24 NOTE — Progress Notes (Signed)
   Covid-19 Vaccination Clinic  Name:  Cindy Lang    MRN: [id number] DOB: [birth year]  06/24/2019  [name] was observed post Covid-19 immunization for 15 minutes without incident. She was provided with Vaccine Information Sheet and instruction to access the V-Safe system.   [name] was instructed to call 911 with any severe reactions post vaccine: Marland Kitchen Difficulty breathing  . Swelling of face and throat  . A fast heartbeat  . A bad rash all over body  . Dizziness and weakness   Immunizations Administered    Name Date Dose VIS Date Route   Pfizer COVID-19 Vaccine 06/24/2019  9:17 AM 0.3 mL 03/25/2019 Intramuscular   Manufacturer: [employer], [employer]   Lot: [id number]   NDC: [address]-5

## 2019-07-18 ENCOUNTER — Ambulatory Visit: Payer: BC Managed Care – PPO | Attending: Internal Medicine

## 2019-07-18 DIAGNOSIS — Z23 Encounter for immunization: Secondary | ICD-10-CM

## 2019-07-18 NOTE — Progress Notes (Signed)
   Covid-19 Vaccination Clinic  Name:  Cindy Lang    MRN: [id number] DOB: 05/14/1970  07/18/2019  Cindy Lang was observed post Covid-19 immunization for 15 minutes without incident. She was provided with Vaccine Information Sheet and instruction to access the V-Safe system.   Cindy Lang was instructed to call 911 with any severe reactions post vaccine: Marland Kitchen Difficulty breathing  . Swelling of face and throat  . A fast heartbeat  . A bad rash all over body  . Dizziness and weakness   Immunizations Administered    Name Date Dose VIS Date Route   Pfizer COVID-19 Vaccine 07/18/2019 11:47 AM 0.3 mL 03/25/2019 Intramuscular   Manufacturer: [employer], [employer]   Lot: [id number]   NDC: [address]-5

## 2019-09-03 DIAGNOSIS — S93402A Sprain of unspecified ligament of left ankle, initial encounter: Secondary | ICD-10-CM | POA: Diagnosis not present

## 2019-09-03 DIAGNOSIS — M25572 Pain in left ankle and joints of left foot: Secondary | ICD-10-CM | POA: Diagnosis not present

## 2019-10-06 DIAGNOSIS — M25572 Pain in left ankle and joints of left foot: Secondary | ICD-10-CM | POA: Diagnosis not present

## 2019-10-19 DIAGNOSIS — M25572 Pain in left ankle and joints of left foot: Secondary | ICD-10-CM | POA: Diagnosis not present

## 2019-10-26 DIAGNOSIS — M25572 Pain in left ankle and joints of left foot: Secondary | ICD-10-CM | POA: Diagnosis not present

## 2019-10-26 DIAGNOSIS — S93402D Sprain of unspecified ligament of left ankle, subsequent encounter: Secondary | ICD-10-CM | POA: Diagnosis not present

## 2019-11-16 DIAGNOSIS — M25572 Pain in left ankle and joints of left foot: Secondary | ICD-10-CM | POA: Diagnosis not present

## 2019-12-01 DIAGNOSIS — Z13220 Encounter for screening for lipoid disorders: Secondary | ICD-10-CM | POA: Diagnosis not present

## 2019-12-01 DIAGNOSIS — R7301 Impaired fasting glucose: Secondary | ICD-10-CM | POA: Diagnosis not present

## 2019-12-01 DIAGNOSIS — L989 Disorder of the skin and subcutaneous tissue, unspecified: Secondary | ICD-10-CM | POA: Diagnosis not present

## 2019-12-01 DIAGNOSIS — Z0000 Encounter for general adult medical examination without abnormal findings: Secondary | ICD-10-CM | POA: Diagnosis not present

## 2019-12-07 DIAGNOSIS — R945 Abnormal results of liver function studies: Secondary | ICD-10-CM | POA: Diagnosis not present

## 2020-01-04 DIAGNOSIS — R945 Abnormal results of liver function studies: Secondary | ICD-10-CM | POA: Diagnosis not present

## 2020-01-12 DIAGNOSIS — D2371 Other benign neoplasm of skin of right lower limb, including hip: Secondary | ICD-10-CM | POA: Diagnosis not present

## 2020-01-12 DIAGNOSIS — R61 Generalized hyperhidrosis: Secondary | ICD-10-CM | POA: Diagnosis not present

## 2020-01-12 DIAGNOSIS — Z85828 Personal history of other malignant neoplasm of skin: Secondary | ICD-10-CM | POA: Diagnosis not present

## 2020-01-12 DIAGNOSIS — D485 Neoplasm of uncertain behavior of skin: Secondary | ICD-10-CM | POA: Diagnosis not present

## 2020-01-13 DIAGNOSIS — R300 Dysuria: Secondary | ICD-10-CM | POA: Diagnosis not present

## 2020-01-31 DIAGNOSIS — N3000 Acute cystitis without hematuria: Secondary | ICD-10-CM | POA: Diagnosis not present

## 2020-01-31 DIAGNOSIS — R300 Dysuria: Secondary | ICD-10-CM | POA: Diagnosis not present

## 2020-02-08 DIAGNOSIS — Z6831 Body mass index (BMI) 31.0-31.9, adult: Secondary | ICD-10-CM | POA: Diagnosis not present

## 2020-02-08 DIAGNOSIS — Z124 Encounter for screening for malignant neoplasm of cervix: Secondary | ICD-10-CM | POA: Diagnosis not present

## 2020-02-08 DIAGNOSIS — Z1231 Encounter for screening mammogram for malignant neoplasm of breast: Secondary | ICD-10-CM | POA: Diagnosis not present

## 2020-02-08 DIAGNOSIS — Z01419 Encounter for gynecological examination (general) (routine) without abnormal findings: Secondary | ICD-10-CM | POA: Diagnosis not present

## 2020-02-16 DIAGNOSIS — R7303 Prediabetes: Secondary | ICD-10-CM | POA: Diagnosis not present

## 2020-02-16 DIAGNOSIS — R3915 Urgency of urination: Secondary | ICD-10-CM | POA: Diagnosis not present

## 2020-02-28 DIAGNOSIS — Z6831 Body mass index (BMI) 31.0-31.9, adult: Secondary | ICD-10-CM | POA: Diagnosis not present

## 2020-02-28 DIAGNOSIS — N72 Inflammatory disease of cervix uteri: Secondary | ICD-10-CM | POA: Diagnosis not present

## 2020-02-28 DIAGNOSIS — R87612 Low grade squamous intraepithelial lesion on cytologic smear of cervix (LGSIL): Secondary | ICD-10-CM | POA: Diagnosis not present

## 2020-04-26 DIAGNOSIS — L219 Seborrheic dermatitis, unspecified: Secondary | ICD-10-CM | POA: Diagnosis not present

## 2020-11-12 DIAGNOSIS — R87622 Low grade squamous intraepithelial lesion on cytologic smear of vagina (LGSIL): Secondary | ICD-10-CM | POA: Diagnosis not present

## 2020-11-12 DIAGNOSIS — R87610 Atypical squamous cells of undetermined significance on cytologic smear of cervix (ASC-US): Secondary | ICD-10-CM | POA: Diagnosis not present

## 2020-12-05 DIAGNOSIS — Z13220 Encounter for screening for lipoid disorders: Secondary | ICD-10-CM | POA: Diagnosis not present

## 2020-12-05 DIAGNOSIS — R7303 Prediabetes: Secondary | ICD-10-CM | POA: Diagnosis not present

## 2020-12-05 DIAGNOSIS — Z0000 Encounter for general adult medical examination without abnormal findings: Secondary | ICD-10-CM | POA: Diagnosis not present

## 2020-12-21 DIAGNOSIS — R945 Abnormal results of liver function studies: Secondary | ICD-10-CM | POA: Diagnosis not present

## 2020-12-26 ENCOUNTER — Other Ambulatory Visit: Payer: Self-pay | Admitting: Physician Assistant

## 2020-12-26 DIAGNOSIS — R7989 Other specified abnormal findings of blood chemistry: Secondary | ICD-10-CM

## 2021-01-09 ENCOUNTER — Ambulatory Visit
Admission: RE | Admit: 2021-01-09 | Discharge: 2021-01-09 | Disposition: A | Discharge status: Home / Self Care | Payer: BC Managed Care – PPO | Source: Ambulatory Visit | Attending: Physician Assistant | Admitting: Physician Assistant

## 2021-01-09 ENCOUNTER — Other Ambulatory Visit: Payer: BC Managed Care – PPO

## 2021-01-09 DIAGNOSIS — R7989 Other specified abnormal findings of blood chemistry: Secondary | ICD-10-CM

## 2021-01-09 DIAGNOSIS — K760 Fatty (change of) liver, not elsewhere classified: Secondary | ICD-10-CM | POA: Diagnosis not present

## 2021-02-06 DIAGNOSIS — R051 Acute cough: Secondary | ICD-10-CM | POA: Diagnosis not present

## 2021-02-06 DIAGNOSIS — J00 Acute nasopharyngitis [common cold]: Secondary | ICD-10-CM | POA: Diagnosis not present

## 2021-02-21 ENCOUNTER — Other Ambulatory Visit: Payer: Self-pay

## 2021-02-21 ENCOUNTER — Ambulatory Visit (INDEPENDENT_AMBULATORY_CARE_PROVIDER_SITE_OTHER): Payer: BC Managed Care – PPO | Admitting: Family Medicine

## 2021-02-21 DIAGNOSIS — R7303 Prediabetes: Secondary | ICD-10-CM

## 2021-02-21 DIAGNOSIS — K760 Fatty (change of) liver, not elsewhere classified: Secondary | ICD-10-CM

## 2021-02-21 DIAGNOSIS — E669 Obesity, unspecified: Secondary | ICD-10-CM

## 2021-02-21 NOTE — Progress Notes (Signed)
Medical Nutrition Therapy Patient has completed 2nd dose of COVID-19 vaccine. PCP Clementeen Graham, PA; [place] [facility]  Appt start time: 1000 end time: 1100 (1 hour) Primary concerns today: Blood sugar control and Non-alcoholic fatty liver disease .   Relevant history/background: Ms. Halbur was referred by Clementeen Graham, PA for prediabetes and NAFLD.  A1c was 5.9 on 12/05/20.   Lipids also elevated on 8/24: TC 204, TG 183, HDL 52, and LD (calc) 120.  Lives with her husband, 2 daughters, 1 son, and 2 grandchildren.  Since diagnoses of NAFLD in August, she has made significant dietary changes, reducing carb foods, and increasing protein, mostly per advice of personal trainer at a gym on [address] (could not remember name).  Ms. Butkiewicz would like to achieve a weight of 160 lb, although her personal trainer suggested a goal weight of 145 lb, which would be a BMI <25.  Note: I suggested to her that a weight of 160 (BMI 26.6) is probably a better goal for her.   Assessment:  Ms. Bonano was accompanied by her daughter Deriyah, who interpreted for [place].  Ms. Sobiech has been preparing separate meals for herself and her family since dietary changes begun 2 months ago.  Her own diet includes very little variety (see recall below), and an exceptionally high protein intake (>170 g/day, 69 g of which comes from 3 protein drinks/day).  A personal trainer at her gym advised her to include protein drinks for snacks, and recommended an eating pattern of 3 meals and 3 snacks, with little or no fruit, carb's, or beans.  It is doubtful that her current diet will be sustainable over the long term, as there it is very restrictive and repetitive.  Also concerning is the very high protein intake, and unnecessary exclusion of a number of nutritious foods such as fruit and beans, which she likes.  She admitted to not even liking the protein drinks, which she has been mixing in water, but has been consuming them  b/c she thought they were good for her.    Learning Readiness: Change in progress (see Assessment above).   Usual eating pattern: 3 meals and 3 snacks per day. Frequent foods and beverages: water, protein shakes, 1-2 c black coffee; chx, [place], vegetables, sweet potatoes, egg whites, oatmeal.    Avoided foods: high-fat foods, breads, rice, white potatoes (since last 2 months), dairy milk (lactose-intol).   Usual physical activity: 45 min stationary cycling 3 X wk; 60 min weight training 2 X wk.  This is an exercise pattern she has maintained for years.   Sleep: ~7 hrs/night.   24-hr recall: (Up at 4:30 AM; and drank coffee) - Cycling class from 5:45 - 6:30 AM; drank water ) -  B (7 AM)-   6 egg whites, 1 c cooked oatmeal, Orgain protein drink w/ 1 tsp macadamia oil, water Snk (10:30)-   3 oz chx, 1 c broccoli, 1/2 c swt potato, 8 almonds, water L (1 PM)-  5 oz chx, 1/2 avocado, 1/2 c swt potato, water Snk (4:30)-  8 oz pro isolates drink (24 g pro) D (6 PM)-  5 oz chx, 1 c broccoli, 3 oz swt potato, water Snk (9 PM)-  8 oz protein shake (24 g pro) Typical day? Yes.   Every day is essentially the same; occasionally switches chx for [place].  Different dinner on Friday at family dinners.  Prior to 2 months ago, intake included 3 meals  and no snacks (similar bkfst) lunch was tacos or chx with beans and rice; typical dinner was chili w/ meat or rice and beans.  Also used to eat bread in moderation, and had more variety of vegetables.    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  As evidenced by usual protein intake of >170 g/day.  Handouts given during visit include: After-Visit Summary (AVS)  Demonstrated degree of understanding via:  Teach Back  Barriers to learning/adherence to lifestyle change: knowledge deficit with respect to nutrition for optimal health.    Monitoring/Evaluation:  Dietary intake, exercise, and body weight in 4  week(s).

## 2021-02-21 NOTE — Patient Instructions (Addendum)
-   You are currently getting more than 170 grams of protein per day, 69 of which come from your protein drinks.  The Recommended Dietary Allowance would have you get 0.8 grams of protein per kg (69 g) per day.  If we instead go with a higher recommendation of 1.2 g/kg body weight, that would be 103 g protein per day.  This level is still considered a HIGH-protein diet.    - Limiting carbohydrate intake will help to prevent blood sugar from going too high, which will then help lower your blood triglycerides, and the fat in your liver.    -The best dietary fats include olive oil, canola oil, avocado, and fats from fish, as well as nuts and seeds.   Recommendations:  1. Reduce protein intake to no more than 105 grams per day.    - Each 1 ounce of meat, fish, poultry, or cheese = 7 grams protein  - 1 egg white = 3.6 grams  - 1 c (of most) beans = 15 grams  2. Limit carb foods (especially sugars) to no more than 2 portions per meal.  One portion of a carb food is equal to:   - bread/tortilla: 1 piece  - "scoopable" carb foods (rice, potatoes, oatmeal): 1/2 cup  - 15 grams TOTAL CARBOHYDRATE as shown on label  3. Obtain twice the volume of vegetables as either protein or starchy foods for both lunch and dinner.  (Also, if you want fruit, feel free to eat whole, fresh fruit.  The few fruits you need to be cautious about in terms of effects on blood sugar, so use small portions of banana, oranges, watermelon, and grapes.)  Apples, pears, peaches, cantaloupe, and most fruits are low- or moderate-glycemic.  This means they won't raise blood sugars a lot.      Follow-up appointment on Thursday, Dec 8 at 2 PM.

## 2021-03-21 ENCOUNTER — Ambulatory Visit: Payer: BC Managed Care – PPO | Admitting: Family Medicine

## 2021-04-24 DIAGNOSIS — Z1231 Encounter for screening mammogram for malignant neoplasm of breast: Secondary | ICD-10-CM | POA: Diagnosis not present

## 2021-05-09 DIAGNOSIS — R102 Pelvic and perineal pain: Secondary | ICD-10-CM | POA: Diagnosis not present

## 2021-05-09 DIAGNOSIS — R350 Frequency of micturition: Secondary | ICD-10-CM | POA: Diagnosis not present

## 2021-05-09 DIAGNOSIS — R7303 Prediabetes: Secondary | ICD-10-CM | POA: Diagnosis not present

## 2021-06-07 DIAGNOSIS — J020 Streptococcal pharyngitis: Secondary | ICD-10-CM | POA: Diagnosis not present

## 2021-11-19 ENCOUNTER — Encounter: Payer: Self-pay | Admitting: Plastic Surgery

## 2021-11-19 ENCOUNTER — Ambulatory Visit: Payer: Self-pay | Admitting: Plastic Surgery

## 2021-11-19 DIAGNOSIS — Z719 Counseling, unspecified: Secondary | ICD-10-CM | POA: Insufficient documentation

## 2021-11-19 NOTE — Progress Notes (Signed)

## 2021-12-12 DIAGNOSIS — Z13220 Encounter for screening for lipoid disorders: Secondary | ICD-10-CM | POA: Diagnosis not present

## 2021-12-12 DIAGNOSIS — Z0000 Encounter for general adult medical examination without abnormal findings: Secondary | ICD-10-CM | POA: Diagnosis not present

## 2021-12-12 DIAGNOSIS — R7303 Prediabetes: Secondary | ICD-10-CM | POA: Diagnosis not present

## 2021-12-12 DIAGNOSIS — R635 Abnormal weight gain: Secondary | ICD-10-CM | POA: Diagnosis not present

## 2022-01-14 DIAGNOSIS — N760 Acute vaginitis: Secondary | ICD-10-CM | POA: Diagnosis not present

## 2022-01-14 DIAGNOSIS — N390 Urinary tract infection, site not specified: Secondary | ICD-10-CM | POA: Diagnosis not present

## 2022-01-27 ENCOUNTER — Other Ambulatory Visit: Payer: Self-pay | Admitting: Family Medicine

## 2022-01-27 DIAGNOSIS — Z1231 Encounter for screening mammogram for malignant neoplasm of breast: Secondary | ICD-10-CM

## 2022-02-19 DIAGNOSIS — Z1211 Encounter for screening for malignant neoplasm of colon: Secondary | ICD-10-CM | POA: Diagnosis not present

## 2022-02-19 DIAGNOSIS — D120 Benign neoplasm of cecum: Secondary | ICD-10-CM | POA: Diagnosis not present

## 2022-03-12 ENCOUNTER — Ambulatory Visit
Admission: RE | Admit: 2022-03-12 | Discharge: 2022-03-12 | Disposition: A | Discharge status: Home / Self Care | Payer: BC Managed Care – PPO | Source: Ambulatory Visit | Attending: Family Medicine | Admitting: Family Medicine

## 2022-03-12 DIAGNOSIS — Z1231 Encounter for screening mammogram for malignant neoplasm of breast: Secondary | ICD-10-CM | POA: Diagnosis not present

## 2022-05-07 DIAGNOSIS — H109 Unspecified conjunctivitis: Secondary | ICD-10-CM | POA: Diagnosis not present

## 2022-05-13 ENCOUNTER — Ambulatory Visit (INDEPENDENT_AMBULATORY_CARE_PROVIDER_SITE_OTHER): Payer: Self-pay | Admitting: Surgical

## 2022-05-13 VITALS — BP 117/76 | HR 72

## 2022-05-13 DIAGNOSIS — Z719 Counseling, unspecified: Secondary | ICD-10-CM

## 2022-05-13 NOTE — Progress Notes (Signed)
Botulinum Toxin Procedure Note  Procedure: Cosmetic botulinum toxin  Pre-operative Diagnosis: Dynamic rhytides  Post-operative Diagnosis: Same  Complications:  None  Brief history: The patient desires botulinum toxin injection.  Her last injection was August 2023, she reports that she did well with this, however feels as if she needs some additional Botox this time.  She reports particularly in the lateral canthal lines.  She is here with her daughter.  She is aware of the risks including bleeding, damage to deeper structures, asymmetry, brow ptosis, eyelid ptosis, bruising. The patient understands and wishes to proceed.  Procedure: The area was prepped with alcohol and dried with a clean gauze.  Using a clean technique the botulinum toxin was diluted with 2.5 mL of bacteriostatic saline per 100 unit vial which resulted in 4 units per 0.1 mL.  Subsequently the mixture was injected in the glabellar, lateral canthal lines, forehead area with preservation of the temporal branch to the lateral eyebrow. A total of 40 Units of botulinum toxin was used. The forehead and glabellar area was injected with care to inject intramuscular only while holding pressure on the supratrochlear vessels in each area during each injection on either side of the medial corrugators. The injection proceeded vertically superiorly to the medial 2/3 of the frontalis muscle and superior 2/3 of the lateral frontalis, again with preservation of the frontal branch.  No complications were noted. Light pressure was held for 5 minutes. She was instructed explicitly in post-operative care.  Botox LOT:  [id number] EXP:  05/2024

## 2022-06-23 ENCOUNTER — Other Ambulatory Visit: Payer: Self-pay | Admitting: Family Medicine

## 2022-06-23 DIAGNOSIS — R102 Pelvic and perineal pain: Secondary | ICD-10-CM | POA: Diagnosis not present

## 2022-06-23 DIAGNOSIS — R1032 Left lower quadrant pain: Secondary | ICD-10-CM | POA: Diagnosis not present

## 2022-07-09 ENCOUNTER — Ambulatory Visit
Admission: RE | Admit: 2022-07-09 | Discharge: 2022-07-09 | Disposition: A | Discharge status: Home / Self Care | Payer: BC Managed Care – PPO | Source: Ambulatory Visit | Attending: Family Medicine | Admitting: Family Medicine

## 2022-07-09 DIAGNOSIS — D251 Intramural leiomyoma of uterus: Secondary | ICD-10-CM | POA: Diagnosis not present

## 2022-07-09 DIAGNOSIS — R1032 Left lower quadrant pain: Secondary | ICD-10-CM | POA: Diagnosis not present

## 2022-08-04 DIAGNOSIS — R102 Pelvic and perineal pain: Secondary | ICD-10-CM | POA: Diagnosis not present

## 2022-10-23 DIAGNOSIS — M25562 Pain in left knee: Secondary | ICD-10-CM | POA: Diagnosis not present

## 2022-10-23 DIAGNOSIS — M25561 Pain in right knee: Secondary | ICD-10-CM | POA: Diagnosis not present

## 2022-10-30 DIAGNOSIS — N760 Acute vaginitis: Secondary | ICD-10-CM | POA: Diagnosis not present

## 2022-10-30 DIAGNOSIS — N390 Urinary tract infection, site not specified: Secondary | ICD-10-CM | POA: Diagnosis not present

## 2022-10-30 DIAGNOSIS — R300 Dysuria: Secondary | ICD-10-CM | POA: Diagnosis not present

## 2022-11-17 ENCOUNTER — Ambulatory Visit (INDEPENDENT_AMBULATORY_CARE_PROVIDER_SITE_OTHER): Payer: Self-pay | Admitting: Plastic Surgery

## 2022-11-17 ENCOUNTER — Ambulatory Visit (INDEPENDENT_AMBULATORY_CARE_PROVIDER_SITE_OTHER): Payer: Self-pay

## 2022-11-17 DIAGNOSIS — Z719 Counseling, unspecified: Secondary | ICD-10-CM

## 2022-11-17 NOTE — Progress Notes (Signed)

## 2022-12-01 ENCOUNTER — Encounter (INDEPENDENT_AMBULATORY_CARE_PROVIDER_SITE_OTHER): Payer: Self-pay

## 2022-12-01 DIAGNOSIS — Z719 Counseling, unspecified: Secondary | ICD-10-CM

## 2022-12-11 ENCOUNTER — Ambulatory Visit (INDEPENDENT_AMBULATORY_CARE_PROVIDER_SITE_OTHER): Payer: Self-pay | Admitting: Plastic Surgery

## 2022-12-11 ENCOUNTER — Encounter: Payer: Self-pay | Admitting: Plastic Surgery

## 2022-12-11 VITALS — BP 112/73 | HR 74

## 2022-12-11 DIAGNOSIS — Z719 Counseling, unspecified: Secondary | ICD-10-CM

## 2022-12-11 NOTE — Progress Notes (Signed)
Filler Injection Procedure Note  Procedure:  Filler administration  Pre-operative Diagnosis: Rytides   Post-operative Diagnosis: Same  Surgeon: Electronically signed by: Wayland Denis, DO   Complications:  None  Brief history: The patient desires injection with fillers in her face. I discussed with the patient this proposed procedure of filler injections, which is customized depending on the particular needs of the patient. It is performed on facial volume loss as a temporary correction. The alternatives were discussed with the patient. The risks were addressed including bleeding, scarring, infection, damage to deeper structures, asymmetry, and chronic pain, which may occur infrequently after a procedure. The individual's choice to undergo a surgical procedure is based on the comparison of risks to potential benefits. Other risks include unsatisfactory results, allergic reaction, which should go away with time, bruising and delayed healing. Fillers do not arrest the aging process or produce permanent tightening.  Operative intervention maybe necessary to maintain the results. The patient understands and wishes to proceed. An informed consent was signed and informational brochures given to her prior to the procedure.  Procedure: The area was prepped with chlorhexidine and dried with a clean gauze. Using a clean technique, a 30 gauge needle was then used to inject the filler into the nasal labial folds and perioral wrinkles. This was done with one syringe.  No complications were noted. Light pressure was held for 5 minutes. She was instructed explicitly in post-operative care.  Restylane Refyne LOT: [id number]

## 2022-12-25 DIAGNOSIS — Z23 Encounter for immunization: Secondary | ICD-10-CM | POA: Diagnosis not present

## 2022-12-25 DIAGNOSIS — Z0000 Encounter for general adult medical examination without abnormal findings: Secondary | ICD-10-CM | POA: Diagnosis not present

## 2022-12-25 DIAGNOSIS — E7800 Pure hypercholesterolemia, unspecified: Secondary | ICD-10-CM | POA: Diagnosis not present

## 2022-12-25 DIAGNOSIS — R7303 Prediabetes: Secondary | ICD-10-CM | POA: Diagnosis not present

## 2022-12-29 ENCOUNTER — Encounter (INDEPENDENT_AMBULATORY_CARE_PROVIDER_SITE_OTHER): Payer: Self-pay

## 2022-12-29 DIAGNOSIS — Z719 Counseling, unspecified: Secondary | ICD-10-CM

## 2023-01-05 ENCOUNTER — Encounter (INDEPENDENT_AMBULATORY_CARE_PROVIDER_SITE_OTHER): Payer: BC Managed Care – PPO

## 2023-01-06 DIAGNOSIS — Z719 Counseling, unspecified: Secondary | ICD-10-CM

## 2023-02-11 ENCOUNTER — Other Ambulatory Visit: Payer: Self-pay | Admitting: Family Medicine

## 2023-02-11 DIAGNOSIS — Z1231 Encounter for screening mammogram for malignant neoplasm of breast: Secondary | ICD-10-CM

## 2023-02-14 DIAGNOSIS — N3001 Acute cystitis with hematuria: Secondary | ICD-10-CM | POA: Diagnosis not present

## 2023-02-14 DIAGNOSIS — N898 Other specified noninflammatory disorders of vagina: Secondary | ICD-10-CM | POA: Diagnosis not present

## 2023-02-14 DIAGNOSIS — R350 Frequency of micturition: Secondary | ICD-10-CM | POA: Diagnosis not present

## 2023-02-24 DIAGNOSIS — Z23 Encounter for immunization: Secondary | ICD-10-CM | POA: Diagnosis not present

## 2023-03-24 ENCOUNTER — Ambulatory Visit
Admission: RE | Admit: 2023-03-24 | Discharge: 2023-03-24 | Disposition: A | Discharge status: Home / Self Care | Payer: BC Managed Care – PPO | Source: Ambulatory Visit | Attending: Family Medicine | Admitting: Family Medicine

## 2023-03-24 DIAGNOSIS — Z1231 Encounter for screening mammogram for malignant neoplasm of breast: Secondary | ICD-10-CM

## 2023-05-11 ENCOUNTER — Encounter (INDEPENDENT_AMBULATORY_CARE_PROVIDER_SITE_OTHER): Payer: Self-pay

## 2023-05-11 DIAGNOSIS — Z719 Counseling, unspecified: Secondary | ICD-10-CM

## 2023-05-15 ENCOUNTER — Ambulatory Visit (INDEPENDENT_AMBULATORY_CARE_PROVIDER_SITE_OTHER): Payer: Self-pay | Admitting: Plastic Surgery

## 2023-05-15 DIAGNOSIS — Z719 Counseling, unspecified: Secondary | ICD-10-CM

## 2023-05-15 NOTE — Progress Notes (Signed)

## 2023-06-08 DIAGNOSIS — Z719 Counseling, unspecified: Secondary | ICD-10-CM

## 2023-07-06 DIAGNOSIS — Z719 Counseling, unspecified: Secondary | ICD-10-CM

## 2023-08-10 ENCOUNTER — Encounter (INDEPENDENT_AMBULATORY_CARE_PROVIDER_SITE_OTHER): Payer: Self-pay

## 2023-08-10 DIAGNOSIS — Z719 Counseling, unspecified: Secondary | ICD-10-CM

## 2023-09-14 DIAGNOSIS — Z719 Counseling, unspecified: Secondary | ICD-10-CM

## 2023-11-23 DIAGNOSIS — Z719 Counseling, unspecified: Secondary | ICD-10-CM

## 2023-12-18 DIAGNOSIS — R300 Dysuria: Secondary | ICD-10-CM | POA: Diagnosis not present

## 2023-12-18 DIAGNOSIS — N390 Urinary tract infection, site not specified: Secondary | ICD-10-CM | POA: Diagnosis not present

## 2023-12-28 DIAGNOSIS — Z719 Counseling, unspecified: Secondary | ICD-10-CM

## 2024-01-01 ENCOUNTER — Ambulatory Visit (INDEPENDENT_AMBULATORY_CARE_PROVIDER_SITE_OTHER): Payer: Self-pay | Admitting: Plastic Surgery

## 2024-01-01 ENCOUNTER — Encounter: Payer: Self-pay | Admitting: Plastic Surgery

## 2024-01-01 DIAGNOSIS — Z719 Counseling, unspecified: Secondary | ICD-10-CM

## 2024-01-01 NOTE — Progress Notes (Signed)

## 2024-01-04 DIAGNOSIS — Z0000 Encounter for general adult medical examination without abnormal findings: Secondary | ICD-10-CM | POA: Diagnosis not present

## 2024-01-04 DIAGNOSIS — Z23 Encounter for immunization: Secondary | ICD-10-CM | POA: Diagnosis not present

## 2024-01-04 DIAGNOSIS — R7303 Prediabetes: Secondary | ICD-10-CM | POA: Diagnosis not present

## 2024-01-04 DIAGNOSIS — K760 Fatty (change of) liver, not elsewhere classified: Secondary | ICD-10-CM | POA: Diagnosis not present

## 2024-01-04 DIAGNOSIS — E7800 Pure hypercholesterolemia, unspecified: Secondary | ICD-10-CM | POA: Diagnosis not present

## 2024-02-23 ENCOUNTER — Other Ambulatory Visit: Payer: Self-pay | Admitting: Family Medicine

## 2024-02-23 DIAGNOSIS — Z1231 Encounter for screening mammogram for malignant neoplasm of breast: Secondary | ICD-10-CM

## 2024-02-29 DIAGNOSIS — Z719 Counseling, unspecified: Secondary | ICD-10-CM

## 2024-03-25 ENCOUNTER — Ambulatory Visit
Admission: RE | Admit: 2024-03-25 | Discharge: 2024-03-25 | Disposition: A | Source: Ambulatory Visit | Attending: Family Medicine | Admitting: Family Medicine

## 2024-03-25 DIAGNOSIS — Z1231 Encounter for screening mammogram for malignant neoplasm of breast: Secondary | ICD-10-CM | POA: Diagnosis not present

## 2024-04-04 ENCOUNTER — Encounter
